# Patient Record
Sex: Male | Born: 1985 | Race: White | Marital: Married | Smoking: Never smoker
Health system: Southern US, Community
[De-identification: ages and names within clinical notes are randomized; demographics above are authoritative.]

## PROBLEM LIST (undated history)

## (undated) DIAGNOSIS — Z789 Other specified health status: Secondary | ICD-10-CM

## (undated) DIAGNOSIS — T8859XA Other complications of anesthesia, initial encounter: Secondary | ICD-10-CM

## (undated) DIAGNOSIS — Z9889 Other specified postprocedural states: Secondary | ICD-10-CM

## (undated) DIAGNOSIS — J45909 Unspecified asthma, uncomplicated: Secondary | ICD-10-CM

---

## 2005-06-03 ENCOUNTER — Emergency Department: Payer: Self-pay | Admitting: Emergency Medicine

## 2007-04-08 ENCOUNTER — Ambulatory Visit: Payer: Self-pay | Admitting: Internal Medicine

## 2009-02-01 ENCOUNTER — Ambulatory Visit: Payer: Self-pay | Admitting: General Practice

## 2010-07-08 ENCOUNTER — Emergency Department: Payer: Self-pay | Admitting: Emergency Medicine

## 2010-07-10 ENCOUNTER — Ambulatory Visit: Payer: Self-pay | Admitting: General Practice

## 2011-04-03 ENCOUNTER — Ambulatory Visit: Payer: Self-pay | Admitting: General Practice

## 2011-08-14 ENCOUNTER — Other Ambulatory Visit: Payer: Self-pay | Admitting: Neurosurgery

## 2011-08-14 DIAGNOSIS — M5410 Radiculopathy, site unspecified: Secondary | ICD-10-CM

## 2011-08-14 DIAGNOSIS — M79603 Pain in arm, unspecified: Secondary | ICD-10-CM

## 2011-08-14 DIAGNOSIS — M542 Cervicalgia: Secondary | ICD-10-CM

## 2011-08-20 ENCOUNTER — Ambulatory Visit
Admission: RE | Admit: 2011-08-20 | Discharge: 2011-08-20 | Disposition: A | Discharge status: Home / Self Care | Payer: PRIVATE HEALTH INSURANCE | Source: Ambulatory Visit | Attending: Neurosurgery | Admitting: Neurosurgery

## 2011-08-20 DIAGNOSIS — M542 Cervicalgia: Secondary | ICD-10-CM

## 2011-08-20 DIAGNOSIS — M79603 Pain in arm, unspecified: Secondary | ICD-10-CM

## 2011-08-20 DIAGNOSIS — M5410 Radiculopathy, site unspecified: Secondary | ICD-10-CM

## 2011-08-20 MED ORDER — IOHEXOL 300 MG/ML  SOLN
8.0000 mL | Freq: Once | INTRAMUSCULAR | Status: AC | PRN
  Administered 2011-08-20: 8 mL via INTRATHECAL

## 2011-08-20 MED ORDER — HYDROCODONE-ACETAMINOPHEN 5-325 MG PO TABS
1.0000 | ORAL_TABLET | Freq: Once | ORAL | Status: AC
  Administered 2011-08-20: 1 via ORAL

## 2011-08-20 MED ORDER — DIAZEPAM 5 MG PO TABS
10.0000 mg | ORAL_TABLET | Freq: Once | ORAL | Status: AC
  Administered 2011-08-20: 10 mg via ORAL

## 2011-08-20 NOTE — Progress Notes (Signed)
Explained discharge instructions to pt and his wife, consent signed and valium given.

## 2011-08-20 NOTE — Patient Instructions (Signed)
Myelogram   Discharge Instructions  1. Go home and rest quietly for the next 24 hours.  It is important to lie flat for the next 24 hours.  Get up only to go to the restroom.  You may lie in the bed or on a couch on your back, your stomach, your left side or your right side.  You may have one pillow under your head.  You may have pillows between your knees while you are on your side or under your knees while you are on your back.  2. DO NOT drive today.  Recline the seat as far back as it will go, while still wearing your seat belt, on the way home.  3. You may get up to go to the bathroom as needed.  You may sit up for 10 minutes to eat.  You may resume your normal diet and medications unless otherwise indicated.  4. The incidence of headache, nausea, or vomiting is about 5% (one in 20 patients).  If you develop a headache, lie flat and drink plenty of fluids until the headache goes away.  Caffeinated beverages may be helpful.  If you develop severe nausea and vomiting or a headache that does not go away with flat bed rest, call [phone].  5. You may resume normal activities after your 24 hours of bed rest is over; however, do not exert yourself strongly or do any heavy lifting tomorrow.  6. Call your physician for a follow-up appointment.  The results of your myelogram will be sent directly to your physician by the following day.  7. If you have any questions or if complications develop after you arrive home, please call [phone].  Discharge instructions have been explained to the patient.  The patient, or the person responsible for the patient, fully understands these instructions.  

## 2012-07-23 ENCOUNTER — Ambulatory Visit: Payer: Self-pay | Admitting: General Practice

## 2013-10-02 ENCOUNTER — Ambulatory Visit: Payer: Self-pay | Admitting: General Practice

## 2015-10-03 ENCOUNTER — Encounter: Payer: Self-pay | Admitting: Physician Assistant

## 2015-10-03 ENCOUNTER — Ambulatory Visit: Payer: Self-pay | Admitting: Family

## 2015-10-03 VITALS — BP 125/80 | HR 69 | Temp 98.5°F

## 2015-10-03 DIAGNOSIS — J019 Acute sinusitis, unspecified: Secondary | ICD-10-CM

## 2015-10-03 MED ORDER — AMOXICILLIN 875 MG PO TABS: 875.0000 mg | ORAL_TABLET | Freq: Two times a day (BID) | ORAL | Status: DC

## 2015-10-03 NOTE — Addendum Note (Signed)
Addended by: Mirian Mo A on: 10/03/2015 11:57 AM   Modules accepted: Orders

## 2015-10-03 NOTE — Progress Notes (Signed)
S / one to two week history of nasal congestion an pressure malaise no fever , denies allergies , SOB   O/ alert pleasant NAD VSS ENT : nasal turbinates swollen, red, friable + left max and frontal tenderness , otherwise unremarkable , neck supple , heart rsr lungs clear A/ acute rhinosinusitis P amoxicillan 875 mg  One bid,  Nasal saline products bid and prn, continue otc. Follow up prn not improving

## 2015-11-08 ENCOUNTER — Ambulatory Visit: Payer: Self-pay | Admitting: Physician Assistant

## 2016-07-23 ENCOUNTER — Ambulatory Visit: Payer: Self-pay | Admitting: Physician Assistant

## 2016-07-23 ENCOUNTER — Encounter: Payer: Self-pay | Admitting: Physician Assistant

## 2016-07-23 VITALS — BP 130/85 | HR 70 | Temp 98.6°F

## 2016-07-23 DIAGNOSIS — M436 Torticollis: Secondary | ICD-10-CM

## 2016-07-23 MED ORDER — BACLOFEN 10 MG PO TABS: 10.0000 mg | ORAL_TABLET | Freq: Three times a day (TID) | ORAL | 0 refills | Status: DC

## 2016-07-23 NOTE — Progress Notes (Signed)
S: c/o pain in neck, spasms, sx for 2 days, [place] way [place] neck got really stiff and couldn't turn it, tried warm rice pack without relief, some numbness and tingling today into left arm, no known injury, just felt like he slept wrong  O: vitals wnl, nad, skin intact, left shoulder with long spasms in midback, decreased rom of neck with hyperextension, grips = b/l, n/v intact  A: torticollis  P: baclofen , ibuprofen 800mg  tid, wet heat followed by ice, consider chiropractor

## 2017-02-21 ENCOUNTER — Ambulatory Visit: Payer: Self-pay | Admitting: Physician Assistant

## 2017-02-21 ENCOUNTER — Encounter: Payer: Self-pay | Admitting: Physician Assistant

## 2017-02-21 VITALS — BP 140/89 | HR 80 | Temp 98.5°F | Resp 16 | Ht 69.0 in | Wt 230.0 lb

## 2017-02-21 DIAGNOSIS — Z0000 Encounter for general adult medical examination without abnormal findings: Secondary | ICD-10-CM

## 2017-02-21 NOTE — Progress Notes (Signed)
   Subjective:Physical exam    Patient ID: Dylan DawsonJoseph J Choi, male    DOB: 1986-03-13, 31 y.o.   MRN: [id number]  HPI Patient present for physical exam. Voices no compliant or concerns.   Review of Systems Negative    Objective:   Physical Exam HEENT unremarkable. Neck supple w/o adenopathy. Lungs CTA and Heart RRR. Abdomen negative HSM, normoactive BS, and SNTTP. No upper/lower extremity deformities. F/E ROM. No spinal deformities. F/E ROM. CNII-XII grossly intact.       Assessment & Plan:Well exam.  Follow up for lab results.

## 2017-04-02 ENCOUNTER — Ambulatory Visit: Payer: Self-pay | Admitting: Physician Assistant

## 2017-04-02 ENCOUNTER — Encounter: Payer: Self-pay | Admitting: Physician Assistant

## 2017-04-02 VITALS — BP 130/80 | HR 75 | Temp 98.7°F | Resp 16

## 2017-04-02 DIAGNOSIS — R635 Abnormal weight gain: Secondary | ICD-10-CM

## 2017-04-02 NOTE — Addendum Note (Signed)
Addended by: Catha BrowEACON, Jery Hollern T on: 04/02/2017 01:53 PM   Modules accepted: Orders

## 2017-04-02 NOTE — Progress Notes (Signed)
S: pt concerned about recent weight gain, states he has gained 20lbs in the past year, states he is a little stressed bc they moved in with his parents while their house is being built, does work out, is only eating lunch and dinner, no snacks or breakfast  O: vitals wnl, nad, lungs c t a, cv rrr, ?thyroid enlarged  A: weight gain  P: discussed eating habits, increase number of times eating per day, cut back on carbs and add protein, will do labs today

## 2017-04-03 LAB — CMP12+LP+TP+TSH+6AC+CBC/D/PLT
ALBUMIN: 4.8 g/dL (ref 3.5–5.5)
ALT: 25 IU/L (ref 0–44)
AST: 22 IU/L (ref 0–40)
Albumin/Globulin Ratio: 2.2 (ref 1.2–2.2)
Alkaline Phosphatase: 112 IU/L (ref 39–117)
BASOS: 0 %
BUN/Creatinine Ratio: 12 (ref 9–20)
BUN: 13 mg/dL (ref 6–20)
Basophils Absolute: 0 10*3/uL (ref 0.0–0.2)
Bilirubin Total: 0.3 mg/dL (ref 0.0–1.2)
CALCIUM: 9.8 mg/dL (ref 8.7–10.2)
CHOL/HDL RATIO: 5.1 ratio — AB (ref 0.0–5.0)
Chloride: 101 mmol/L (ref 96–106)
Cholesterol, Total: 204 mg/dL — ABNORMAL HIGH (ref 100–199)
Creatinine, Ser: 1.13 mg/dL (ref 0.76–1.27)
EOS (ABSOLUTE): 0.4 10*3/uL (ref 0.0–0.4)
ESTIMATED CHD RISK: 1.1 times avg. — AB (ref 0.0–1.0)
Eos: 5 %
Free Thyroxine Index: 1.5 (ref 1.2–4.9)
GFR calc Af Amer: 100 mL/min/{1.73_m2} (ref >59)
GFR calc non Af Amer: 86 mL/min/{1.73_m2} (ref >59)
GGT: 21 IU/L (ref 0–65)
GLOBULIN, TOTAL: 2.2 g/dL (ref 1.5–4.5)
Glucose: 84 mg/dL (ref 65–99)
HDL: 40 mg/dL (ref >39)
HEMATOCRIT: 46.1 % (ref 37.5–51.0)
Hemoglobin: 15.4 g/dL (ref 13.0–17.7)
Immature Grans (Abs): 0 10*3/uL (ref 0.0–0.1)
Immature Granulocytes: 0 %
Iron: 129 ug/dL (ref 38–169)
LDH: 212 IU/L (ref 121–224)
LYMPHS: 23 %
Lymphocytes Absolute: 1.6 10*3/uL (ref 0.7–3.1)
MCH: 27.7 pg (ref 26.6–33.0)
MCHC: 33.4 g/dL (ref 31.5–35.7)
MCV: 83 fL (ref 79–97)
MONOS ABS: 0.5 10*3/uL (ref 0.1–0.9)
Monocytes: 7 %
NEUTROS ABS: 4.5 10*3/uL (ref 1.4–7.0)
Neutrophils: 65 %
PHOSPHORUS: 4.1 mg/dL (ref 2.5–4.5)
POTASSIUM: 4.3 mmol/L (ref 3.5–5.2)
Platelets: 298 10*3/uL (ref 150–379)
RBC: 5.56 x10E6/uL (ref 4.14–5.80)
RDW: 13 % (ref 12.3–15.4)
SODIUM: 143 mmol/L (ref 134–144)
T3 Uptake Ratio: 27 % (ref 24–39)
T4, Total: 5.5 ug/dL (ref 4.5–12.0)
TRIGLYCERIDES: 464 mg/dL — AB (ref 0–149)
TSH: 2.27 u[IU]/mL (ref 0.450–4.500)
Total Protein: 7 g/dL (ref 6.0–8.5)
Uric Acid: 6.1 mg/dL (ref 3.7–8.6)
WBC: 6.9 10*3/uL (ref 3.4–10.8)

## 2017-04-05 LAB — HGB A1C W/O EAG: Hgb A1c MFr Bld: 5.2 % (ref 4.8–5.6)

## 2017-04-05 LAB — SPECIMEN STATUS REPORT

## 2018-03-06 ENCOUNTER — Ambulatory Visit: Payer: Self-pay | Admitting: Family Medicine

## 2018-03-06 VITALS — BP 128/82 | HR 71 | Ht 69.0 in | Wt 236.0 lb

## 2018-03-06 DIAGNOSIS — Z008 Encounter for other general examination: Secondary | ICD-10-CM

## 2018-03-06 DIAGNOSIS — Z0189 Encounter for other specified special examinations: Principal | ICD-10-CM

## 2018-03-06 NOTE — Progress Notes (Signed)
Subjective: Annual biometrics screening  Patient presents for his annual biometric screening. Patient reports eating a healthy, well-rounded diet and getting regular physical activity.  Patient does not regularly see a primary care provider. PCP: None currently. Patient works for the [employer]. Patient denies any other issues or concerns.   Review of Systems Unremarkable  Objective  Physical Exam General: Awake, alert and oriented. No acute distress. Well developed, hydrated and nourished. Appears stated age.  HEENT: Supple neck without adenopathy. Sclera is non-icteric. The ear canal is clear without discharge. The tympanic membrane is normal in appearance with normal landmarks and cone of light. Nasal mucosa is pink and moist. Oral mucosa is pink and moist. The pharynx is normal in appearance without tonsillar swelling or exudates.  Skin: Skin in warm, dry and intact without rashes or lesions. Appropriate color for ethnicity. Cardiac: Heart rate and rhythm are normal. No murmurs, gallops, or rubs are auscultated.  Respiratory: The chest wall is symmetric and without deformity. No signs of respiratory distress. Lung sounds are clear in all lobes bilaterally without rales, ronchi, or wheezes.  Neurological: The patient is awake, alert and oriented to person, place, and time with normal speech.  Memory is normal and thought processes intact. No gait abnormalities are appreciated.  Psychiatric: Appropriate mood and affect.   Assessment Annual biometrics screening  Plan  Lipid panel and fasting blood sugar pending. Encouraged routine visits with primary care provider.  Provided patient with a list of local resources and encouraged him to establish care with a primary care provider. Encouraged patient to get regular exercise and eat a healthy, well-rounded diet.

## 2018-03-07 LAB — LIPID PANEL
Chol/HDL Ratio: 4 ratio (ref 0.0–5.0)
Cholesterol, Total: 187 mg/dL (ref 100–199)
HDL: 47 mg/dL (ref >39)
LDL Calculated: 120 mg/dL — ABNORMAL HIGH (ref 0–99)
TRIGLYCERIDES: 102 mg/dL (ref 0–149)
VLDL CHOLESTEROL CAL: 20 mg/dL (ref 5–40)

## 2018-03-07 LAB — GLUCOSE, RANDOM: GLUCOSE: 79 mg/dL (ref 65–99)

## 2018-03-10 NOTE — Progress Notes (Signed)
Dylan Choi, Will you call the patient and inform them that their lipid panel and fasting blood sugar came back?  Everything is normal, with the exception of his LDL cholesterol. The LDL cholesterol ("bad cholesterol") is elevated at 120, normal values are below 99. Please advise the patient to follow-up with their primary care provider regarding these results.

## 2019-07-06 ENCOUNTER — Other Ambulatory Visit: Payer: Self-pay

## 2019-07-06 ENCOUNTER — Ambulatory Visit: Payer: Managed Care, Other (non HMO) | Admitting: Internal Medicine

## 2019-07-06 VITALS — BP 118/78 | HR 85 | Temp 97.6°F | Resp 20 | Ht 69.0 in | Wt 250.0 lb

## 2019-07-06 DIAGNOSIS — E6609 Other obesity due to excess calories: Secondary | ICD-10-CM

## 2019-07-06 DIAGNOSIS — Z6836 Body mass index (BMI) 36.0-36.9, adult: Secondary | ICD-10-CM | POA: Diagnosis not present

## 2019-07-06 DIAGNOSIS — Z008 Encounter for other general examination: Secondary | ICD-10-CM | POA: Diagnosis not present

## 2019-07-06 NOTE — Progress Notes (Signed)
[facility]  Patient: Dylan Choi, DOB - 09-21-1985  Subjective:  Here for Biometric Screen/brief exam Patient is a 33 year old male in no acute distress who comes to the clinic for his biometric screening and brief exam. Patient does not regularly see a primary care provider. He is employed by [employer] and works as a Tax adviser, [employer]. He reports he "usually" tries to eat a healthy diet, and admits in the last 6 months, has not been exercising regularly (besides chasing around his 34 year old) with gym closure with Covid an issue  He denies any concerns at today's visit.  He reports he feels well.   Patient  denies any fever, body aches,chills, rash, chest pain, shortness of breath, nausea, vomiting, abdominal pain, or diarrhea.  He denies tobacco use, drug use. Drinks alcohol occasionally, social.  He denies any known drug allergies.  Has an allergy to cats.   He denies any routine medications (takes ibuprofen prn) or chronic health problems.  Objective: BP 118/78 (BP Location: Left Arm, Patient Position: Sitting, Cuff Size: Large)   Pulse 85   Temp 97.6 F (36.4 C) (Temporal)   Resp 20   Ht 5\' 9"  (1.753 m)   Wt 250 lb (113.4 kg)   SpO2 100%   BMI 36.92 kg/m   NAD, well-developed well-nourished, overweight Patient is alert and oriented and responsive to questions. Engages in eye contact with provider. Speaks in full sentences without any pauses without any shortness of breath or distress.  HEENT: Within normal limits Neck: supple, thyroid normal, no anterior or posterior cervical lymphadenopathy. Heart: Regular rate and rhythm without murmurs rubs or gallops.   Lungs: Clear auscultation without any adventitious lung sounds. Abdomen: mildly obese, NT Neuro: Patient moves on and off of exam table and in room without difficulty. Gait is normal and grossly non-focal.  Assessment: Biometric  screen 1. Encounter for other general examination-brief biometric exam with biometric screening-this is not a full annual physical.   2. Encounter for biometric screening  3. Increased BMI   Plan:  I will have the office call you on your glucose and cholesterol results when they return if you have not heard within 1 week please call the office or sent to your Mychart account. Please call the clinic during office hours with any questions or concerns.  This biometric physical is a brief physical and the only labs done are glucose and your lipid panel(cholesterol) and is  not a substitute for seeing a primary care provider for a complete annual physical. Please see a primary care physician for routine health maintenance, labs and full physical at least yearly and follow up as recommended by your provider. Provider also recommends if you do not have a primary care provider for patient to establish care as soon as possible .Patient may chose provider of choice. Also gave the [facility] at [phone] or web site at [email or web address] to help assist with finding a primary care doctor.  Patient verbalizes understanding that his office is acute care only and not a substitute for a primary care or for the management of chronic conditions.    Fasting glucose and lipids. Discussed with patient that today's visit here is a limited biometric screening visit (not a comprehensive exam or management of any chronic problems) Discussed some health issues, including healthy eating habits and importance of regular aerobic exercise and encouraged to increase, noted his BMI was  increased and he would fall in the obesity category based on BMI and he was aware of that with importance of weight management over time encouraged. Encouraged to follow-up with PCP for annual comprehensive preventive and wellness care over time (and if applicable, any chronic issues). Questions  invited and answered.  Note by C. Dorris Fetch, MD

## 2019-07-07 LAB — LIPID PANEL
Chol/HDL Ratio: 4.1 ratio (ref 0.0–5.0)
Cholesterol, Total: 189 mg/dL (ref 100–199)
HDL: 46 mg/dL (ref >39)
LDL Chol Calc (NIH): 120 mg/dL — ABNORMAL HIGH (ref 0–99)
Triglycerides: 130 mg/dL (ref 0–149)
VLDL Cholesterol Cal: 23 mg/dL (ref 5–40)

## 2019-07-07 LAB — GLUCOSE, RANDOM: Glucose: 92 mg/dL (ref 65–99)

## 2019-07-07 NOTE — Progress Notes (Signed)
Spoke with patient regarding his lab results and dietary recommendations given by the Provider. Patient verbalized understanding.

## 2020-03-01 ENCOUNTER — Other Ambulatory Visit: Payer: Self-pay

## 2020-03-01 ENCOUNTER — Other Ambulatory Visit: Payer: Self-pay | Admitting: Infectious Diseases

## 2020-03-01 ENCOUNTER — Ambulatory Visit
Admission: RE | Admit: 2020-03-01 | Discharge: 2020-03-01 | Disposition: A | Discharge status: Home / Self Care | Payer: Managed Care, Other (non HMO) | Source: Ambulatory Visit | Attending: Infectious Diseases | Admitting: Infectious Diseases

## 2020-03-01 DIAGNOSIS — R109 Unspecified abdominal pain: Secondary | ICD-10-CM | POA: Diagnosis present

## 2020-03-01 DIAGNOSIS — R1031 Right lower quadrant pain: Secondary | ICD-10-CM | POA: Diagnosis present

## 2020-03-06 ENCOUNTER — Emergency Department
Admission: EM | Admit: 2020-03-06 | Discharge: 2020-03-06 | Disposition: A | Discharge status: Home / Self Care | Payer: Managed Care, Other (non HMO) | Attending: Emergency Medicine | Admitting: Emergency Medicine

## 2020-03-06 ENCOUNTER — Other Ambulatory Visit: Payer: Self-pay

## 2020-03-06 ENCOUNTER — Emergency Department: Payer: Managed Care, Other (non HMO)

## 2020-03-06 DIAGNOSIS — N200 Calculus of kidney: Secondary | ICD-10-CM | POA: Diagnosis not present

## 2020-03-06 DIAGNOSIS — R109 Unspecified abdominal pain: Secondary | ICD-10-CM | POA: Diagnosis present

## 2020-03-06 LAB — URINALYSIS, COMPLETE (UACMP) WITH MICROSCOPIC
Bilirubin Urine: NEGATIVE
Glucose, UA: NEGATIVE mg/dL
Ketones, ur: NEGATIVE mg/dL
Leukocytes,Ua: NEGATIVE
Nitrite: NEGATIVE
Protein, ur: NEGATIVE mg/dL
Specific Gravity, Urine: 1.015 (ref 1.005–1.030)
pH: 5 (ref 5.0–8.0)

## 2020-03-06 LAB — HEPATIC FUNCTION PANEL
ALT: 31 U/L (ref 0–44)
AST: 30 U/L (ref 15–41)
Albumin: 4.7 g/dL (ref 3.5–5.0)
Alkaline Phosphatase: 91 U/L (ref 38–126)
Bilirubin, Direct: 0.1 mg/dL (ref 0.0–0.2)
Indirect Bilirubin: 0.6 mg/dL (ref 0.3–0.9)
Total Bilirubin: 0.7 mg/dL (ref 0.3–1.2)
Total Protein: 7.4 g/dL (ref 6.5–8.1)

## 2020-03-06 LAB — BASIC METABOLIC PANEL
Anion gap: 10 (ref 5–15)
BUN: 14 mg/dL (ref 6–20)
CO2: 28 mmol/L (ref 22–32)
Calcium: 9.5 mg/dL (ref 8.9–10.3)
Chloride: 102 mmol/L (ref 98–111)
Creatinine, Ser: 1.25 mg/dL — ABNORMAL HIGH (ref 0.61–1.24)
GFR calc Af Amer: >60 mL/min (ref >60)
GFR calc non Af Amer: >60 mL/min (ref >60)
Glucose, Bld: 105 mg/dL — ABNORMAL HIGH (ref 70–99)
Potassium: 4.1 mmol/L (ref 3.5–5.1)
Sodium: 140 mmol/L (ref 135–145)

## 2020-03-06 LAB — CBC
HCT: 42.5 % (ref 39.0–52.0)
Hemoglobin: 14 g/dL (ref 13.0–17.0)
MCH: 25.5 pg — ABNORMAL LOW (ref 26.0–34.0)
MCHC: 32.9 g/dL (ref 30.0–36.0)
MCV: 77.3 fL — ABNORMAL LOW (ref 80.0–100.0)
Platelets: 291 10*3/uL (ref 150–400)
RBC: 5.5 MIL/uL (ref 4.22–5.81)
RDW: 13.8 % (ref 11.5–15.5)
WBC: 10.7 10*3/uL — ABNORMAL HIGH (ref 4.0–10.5)
nRBC: 0 % (ref 0.0–0.2)

## 2020-03-06 LAB — LIPASE, BLOOD: Lipase: 32 U/L (ref 11–51)

## 2020-03-06 MED ORDER — MORPHINE SULFATE (PF) 4 MG/ML IV SOLN
4.0000 mg | Freq: Once | INTRAVENOUS | Status: AC
  Administered 2020-03-06: 4 mg via INTRAVENOUS
  Filled 2020-03-06: qty 1

## 2020-03-06 MED ORDER — SODIUM CHLORIDE 0.9 % IV BOLUS
1000.0000 mL | Freq: Once | INTRAVENOUS | Status: AC
  Administered 2020-03-06: 1000 mL via INTRAVENOUS

## 2020-03-06 MED ORDER — OXYCODONE HCL 5 MG PO TABS
5.0000 mg | ORAL_TABLET | Freq: Once | ORAL | Status: AC
  Administered 2020-03-06: 5 mg via ORAL
  Filled 2020-03-06: qty 1

## 2020-03-06 MED ORDER — HYDROMORPHONE HCL 1 MG/ML IJ SOLN
1.0000 mg | Freq: Once | INTRAMUSCULAR | Status: DC | PRN
  Filled 2020-03-06: qty 1

## 2020-03-06 MED ORDER — METOCLOPRAMIDE HCL 5 MG/ML IJ SOLN
10.0000 mg | Freq: Once | INTRAMUSCULAR | Status: AC
  Administered 2020-03-06: 10 mg via INTRAVENOUS
  Filled 2020-03-06: qty 2

## 2020-03-06 MED ORDER — KETOROLAC TROMETHAMINE 30 MG/ML IJ SOLN
15.0000 mg | Freq: Once | INTRAMUSCULAR | Status: AC
  Administered 2020-03-06: 15 mg via INTRAVENOUS
  Filled 2020-03-06: qty 1

## 2020-03-06 MED ORDER — HYDROMORPHONE HCL 1 MG/ML IJ SOLN
1.0000 mg | Freq: Once | INTRAMUSCULAR | Status: AC
  Administered 2020-03-06: 1 mg via INTRAVENOUS
  Filled 2020-03-06: qty 1

## 2020-03-06 MED ORDER — ONDANSETRON HCL 4 MG/2ML IJ SOLN
4.0000 mg | Freq: Once | INTRAMUSCULAR | Status: AC
  Administered 2020-03-06: 4 mg via INTRAVENOUS
  Filled 2020-03-06: qty 2

## 2020-03-06 NOTE — Discharge Instructions (Addendum)
You can continue take your pain medication, nausea medicine and Flomax prescribed by your primary care doctor.  If you need more pain medication call your primary care doctor tomorrow.  You can also take some ibuprofen 600 mg every 8 hours with food to help with any discomfort.  Your stone seems to be moving but still needs some time to move all the way out.  He should call urology on Monday saying ER follow-up this week.  Return to the ER if you develop fevers, worsening pain, vomiting or any other concerns  IMPRESSION: 1. There is a 4 mm stone in the distal right ureter. This stone was seen proximally previously but is now located distally just above the right UVJ. The distal ureteral stone results in mild to moderate hydronephrosis, perinephric stranding, and ureterectasis. 2. Other renal stones as above.

## 2020-03-06 NOTE — ED Triage Notes (Signed)
Pt states known right renal calculi. Pt states right flank pain with vomiting that began last at 2100. Pt denies diarrhea, fever.

## 2020-03-06 NOTE — ED Provider Notes (Signed)
[facility] Emergency Department Provider Note  ____________________________________________   First MD Initiated Contact with Patient 03/06/20 [phone]     (approximate)  I have reviewed the triage vital signs and the nursing notes.   HISTORY  Chief Complaint Flank Pain    HPI Dylan Choi is a 34 y.o. male who is otherwise healthy who comes in for kidney stone.  Patient had a kidney stone diagnosed recently.  States he is never passed a kidney stone before.  States that he is pain initially began earlier this week but then seemed to resolve but he had continuation of his pain today that started tonight, severe, constant, not better with Vicodin, nothing made it worse.  He does report a little bit of urinary frequency but no pain.  No fevers.  Did have an episode of vomiting.  On review of records on 6/22 patient had a 4 mm calculus in the proximal third of his right ureter with some mild associated right hydronephrosis.     Medical: Otherwise healthy   There are no problems to display for this patient.    Prior to Admission medications   Medication Sig Start Date End Date Taking? Authorizing Provider  baclofen (LIORESAL) 10 MG tablet Take 1 tablet (10 mg total) by mouth 3 (three) times daily. Patient not taking: Reported on 02/21/2017 07/23/16   Faythe Ghee, PA-C  beclomethasone (QVAR) 80 MCG/ACT inhaler Inhale into the lungs. 12/23/14 12/23/15  [provider]  ibuprofen (ADVIL,MOTRIN) 800 MG tablet Take by mouth. 11/11/15   [provider]    Allergies Patient has no known allergies.  No family history on file.  Social History Social History   Tobacco Use  . Smoking status: Never Smoker  . Smokeless tobacco: Never Used  Substance Use Topics  . Alcohol use: Not on file  . Drug use: Not on file      Review of Systems Constitutional: No fever/chills Eyes: No visual changes. ENT: No sore throat. Cardiovascular:  Denies chest pain. Respiratory: Denies shortness of breath. Gastrointestinal: Positive abdominal pain, vomiting Genitourinary: Negative for dysuria. Musculoskeletal: Negative for back pain. Skin: Negative for rash. Neurological: Negative for headaches, focal weakness or numbness. All other ROS negative ____________________________________________   PHYSICAL EXAM:  VITAL SIGNS: ED Triage Vitals [03/06/20 0352]  Enc Vitals Group     BP (!) 154/96     Pulse Rate 83     Resp 16     Temp 99 F (37.2 C)     Temp Source Oral     SpO2 100 %     Weight 225 lb (102.1 kg)     Height 5\' 9"  (1.753 m)     Head Circumference      Peak Flow      Pain Score 8     Pain Loc      Pain Edu?      Excl. in GC?     Constitutional: Alert and oriented.  Patient is walking around in the room due to pain Eyes: Conjunctivae are normal. EOMI. Head: Atraumatic. Nose: No congestion/rhinnorhea. Mouth/Throat: Mucous membranes are moist.   Neck: No stridor. Trachea Midline. FROM Cardiovascular: Normal rate, regular rhythm. Grossly normal heart sounds.  Good peripheral circulation. Respiratory: Normal respiratory effort.  No retractions. Lungs CTAB. Gastrointestinal: Soft and nontender. No distention. No abdominal bruits.  Musculoskeletal: No lower extremity tenderness nor edema.  No joint effusions. Neurologic:  Normal speech and language. No gross focal neurologic deficits  are appreciated.  Skin:  Skin is warm, dry and intact. No rash noted. Psychiatric: Mood and affect are normal. Speech and behavior are normal. GU: Deferred  Back: Right flank tenderness ____________________________________________   LABS (all labs ordered are listed, but only abnormal results are displayed)  Labs Reviewed  URINALYSIS, COMPLETE (UACMP) WITH MICROSCOPIC - Abnormal; Notable for the following components:      Result Value   Color, Urine YELLOW (*)    APPearance CLEAR (*)    Hgb urine dipstick SMALL (*)     Bacteria, UA RARE (*)    All other components within normal limits  CBC - Abnormal; Notable for the following components:   WBC 10.7 (*)    MCV 77.3 (*)    MCH 25.5 (*)    All other components within normal limits  BASIC METABOLIC PANEL - Abnormal; Notable for the following components:   Glucose, Bld 105 (*)    Creatinine, Ser 1.25 (*)    All other components within normal limits  HEPATIC FUNCTION PANEL  LIPASE, BLOOD   ____________________________________________   RADIOLOGY   Official radiology report(s): CT Renal Stone Study  Result Date: 03/06/2020 CLINICAL DATA:  Flank pain.  Kidney suspected. EXAM: CT ABDOMEN AND PELVIS WITHOUT CONTRAST TECHNIQUE: Multidetector CT imaging of the abdomen and pelvis was performed following the standard protocol without IV contrast. COMPARISON:  March 01, 2020 FINDINGS: Lower chest: No acute abnormality. Hepatobiliary: No focal liver abnormality is seen. No gallstones, gallbladder wall thickening, or biliary dilatation. Pancreas: Unremarkable. No pancreatic ductal dilatation or surrounding inflammatory changes. Spleen: Normal in size without focal abnormality. Adrenals/Urinary Tract: Adrenal glands are normal. At least 3 tiny stones are seen in the left kidney without hydronephrosis or perinephric stranding. Two stones are seen in the right kidney. The largest measures between 3 and 4 mm on coronal image 88. Hydronephrosis is seen on the right, mild to moderate. Mild perinephric stranding is identified. Right ureterectasis and periureteral stranding is noted. The proximal right ureteral stone on the previous study has migrated into the distal right ureter, just above the right UVJ measuring 4 mm. The left ureter is normal. The bladder is decompressed but grossly unremarkable given lack of distention. Stomach/Bowel: Stomach and small bowel are normal. The colon is normal. The appendix is normal. Vascular/Lymphatic: No significant vascular findings are  present. No enlarged abdominal or pelvic lymph nodes. Reproductive: Prostate is unremarkable. Other: No abdominal wall hernia or abnormality. No abdominopelvic ascites. Musculoskeletal: No acute or significant osseous findings. IMPRESSION: 1. There is a 4 mm stone in the distal right ureter. This stone was seen proximally previously but is now located distally just above the right UVJ. The distal ureteral stone results in mild to moderate hydronephrosis, perinephric stranding, and ureterectasis. 2. Other renal stones as above. Electronically Signed   By: Gerome Sam III M.D   On: 03/06/2020 05:18    ____________________________________________   PROCEDURES  Procedure(s) performed (including Critical Care):  Procedures   ____________________________________________   INITIAL IMPRESSION / ASSESSMENT AND PLAN / ED COURSE  Dylan Choi was evaluated in Emergency Department on 03/06/2020 for the symptoms described in the history of present illness. He was evaluated in the context of the global COVID-19 pandemic, which necessitated consideration that the patient might be at risk for infection with the SARS-CoV-2 virus that causes COVID-19. Institutional protocols and algorithms that pertain to the evaluation of patients at risk for COVID-19 are in a state of rapid change based on information  released by regulatory bodies including the CDC and federal and state organizations. These policies and algorithms were followed during the patient's care in the ED.    Patient is a 34 year old who comes in with worsening right flank pain.  Initially his pain had resolved but seem to gotten worse starting today.  Most likely this is progression of his kidney stone.  Discussed with family on the pros and cons of repeat CT imaging today but family would like to proceed to make sure the stone is passing, there is no additional stones, no worsening hydronephrosis, no evidence of renal rupture or other acute  pathology.  Patient does not have any fevers but will get urine to make sure no evidence of UTI.  Will give patient a dose of IV Dilaudid and IV Zofran and IV fluid while awaiting results  Labs are reassuring.  Patient has only slight white count elevation at 10.7. he is got no WBCs in his urine and there is does not seem to be an infection. Denies being on antibiotics.  Kidney function was slightly elevated at 1.25.  Patient was given 1 L of fluid suspect that this is better now  CT scan shows that the 4 mm stone is now in the distal right ureter with now mild to moderate hydronephrosis and some perinephric stranding.  Patient stated that the Dilaudid did not seem to really work and is requesting some morphine.  This is worked previously for him.  We will give a dose of IV morphine and some Reglan.  Patient was given a dose of Toradol as well to help with discomfort.  6:47 AM on repeat evaluation patient states his pain is much better.  We discussed doing a dose of oxycodone as he has IV medications wear off.  Patient expressed understanding felt comfortable with this plan.  Patient does not show any signs of respiratory depression.  Discussed with patient indications for admission significant pain not controlled with medications, recurrent vomiting, evidence of fever.  At this time patient feels comfortable try to go back home with his p.o. medications.  Patient still has some Vicodin prescribed by his primary care doctor so we will finish this course first and then contact his primary care doctor if he needs additional medicine.  He already has tamsulosin and Zofran at this time.  I also given him urology's number to call on Monday to try to get follow-up for this week in case patient is not passing the stone he can be considered for lithotripsy.     ____________________________________________   FINAL CLINICAL IMPRESSION(S) / ED DIAGNOSES   Final diagnoses:  Nephrolithiasis       MEDICATIONS GIVEN DURING THIS VISIT:  Medications  HYDROmorphone (DILAUDID) injection 1 mg (1 mg Intravenous Given 03/06/20 0413)  ondansetron (ZOFRAN) injection 4 mg (4 mg Intravenous Given 03/06/20 0414)  sodium chloride 0.9 % bolus 1,000 mL (0 mLs Intravenous Stopped 03/06/20 0538)  ketorolac (TORADOL) 30 MG/ML injection 15 mg (15 mg Intravenous Given 03/06/20 0451)  morphine 4 MG/ML injection 4 mg (4 mg Intravenous Given 03/06/20 0540)  metoCLOPramide (REGLAN) injection 10 mg (10 mg Intravenous Given 03/06/20 0539)  oxyCODONE (Oxy IR/ROXICODONE) immediate release tablet 5 mg (5 mg Oral Given 03/06/20 5329)     ED Discharge Orders    None       Note:  This document was prepared using Dragon voice recognition software and may include unintentional dictation errors.   Vanessa Anita, MD 03/06/20 [phone]

## 2020-03-10 ENCOUNTER — Other Ambulatory Visit: Payer: Self-pay | Admitting: *Deleted

## 2020-03-10 DIAGNOSIS — J45909 Unspecified asthma, uncomplicated: Secondary | ICD-10-CM | POA: Insufficient documentation

## 2020-03-10 DIAGNOSIS — N200 Calculus of kidney: Secondary | ICD-10-CM

## 2020-03-15 ENCOUNTER — Ambulatory Visit (INDEPENDENT_AMBULATORY_CARE_PROVIDER_SITE_OTHER): Payer: Managed Care, Other (non HMO) | Admitting: Urology

## 2020-03-15 ENCOUNTER — Ambulatory Visit
Admission: RE | Admit: 2020-03-15 | Discharge: 2020-03-15 | Disposition: A | Discharge status: Home / Self Care | Payer: Managed Care, Other (non HMO) | Attending: Urology | Admitting: Urology

## 2020-03-15 ENCOUNTER — Encounter: Payer: Self-pay | Admitting: Urology

## 2020-03-15 ENCOUNTER — Ambulatory Visit
Admission: RE | Admit: 2020-03-15 | Discharge: 2020-03-15 | Disposition: A | Discharge status: Home / Self Care | Payer: Managed Care, Other (non HMO) | Source: Ambulatory Visit | Attending: Urology | Admitting: Urology

## 2020-03-15 ENCOUNTER — Other Ambulatory Visit
Admission: RE | Admit: 2020-03-15 | Discharge: 2020-03-15 | Disposition: A | Discharge status: Home / Self Care | Payer: Managed Care, Other (non HMO) | Source: Home / Self Care | Attending: Urology | Admitting: Urology

## 2020-03-15 ENCOUNTER — Telehealth: Payer: Self-pay

## 2020-03-15 ENCOUNTER — Other Ambulatory Visit: Payer: Self-pay

## 2020-03-15 VITALS — BP 131/85 | HR 74 | Ht 69.0 in | Wt 233.0 lb

## 2020-03-15 DIAGNOSIS — N201 Calculus of ureter: Secondary | ICD-10-CM

## 2020-03-15 DIAGNOSIS — N200 Calculus of kidney: Secondary | ICD-10-CM | POA: Insufficient documentation

## 2020-03-15 LAB — URINALYSIS, COMPLETE (UACMP) WITH MICROSCOPIC
Bacteria, UA: NONE SEEN
Bilirubin Urine: NEGATIVE
Glucose, UA: NEGATIVE mg/dL
Hgb urine dipstick: NEGATIVE
Ketones, ur: NEGATIVE mg/dL
Leukocytes,Ua: NEGATIVE
Nitrite: NEGATIVE
Protein, ur: NEGATIVE mg/dL
Specific Gravity, Urine: >1.03 — ABNORMAL HIGH (ref 1.005–1.030)
pH: 5.5 (ref 5.0–8.0)

## 2020-03-15 NOTE — Patient Instructions (Signed)
Dietary Guidelines to Help Prevent Kidney Stones Kidney stones are deposits of minerals and salts that form inside your kidneys. Your risk of developing kidney stones may be greater depending on your diet, your lifestyle, the medicines you take, and whether you have certain medical conditions. Most people can reduce their chances of developing kidney stones by following the instructions below. Depending on your overall health and the type of kidney stones you tend to develop, your dietitian may give you more specific instructions. What are tips for following this plan? Reading food labels  Choose foods with "no salt added" or "low-salt" labels. Limit your sodium intake to less than 1500 mg per day.  Choose foods with calcium for each meal and snack. Try to eat about 300 mg of calcium at each meal. Foods that contain 200-500 mg of calcium per serving include: ? 8 oz (237 ml) of milk, fortified nondairy milk, and fortified fruit juice. ? 8 oz (237 ml) of kefir, yogurt, and soy yogurt. ? 4 oz (118 ml) of tofu. ? 1 oz of cheese. ? 1 cup (300 g) of dried figs. ? 1 cup (91 g) of cooked broccoli. ? 1-3 oz can of sardines or mackerel.  Most people need 1000 to 1500 mg of calcium each day. Talk to your dietitian about how much calcium is recommended for you. Shopping  Buy plenty of fresh fruits and vegetables. Most people do not need to avoid fruits and vegetables, even if they contain nutrients that may contribute to kidney stones.  When shopping for convenience foods, choose: ? Whole pieces of fruit. ? Premade salads with dressing on the side. ? Low-fat fruit and yogurt smoothies.  Avoid buying frozen meals or prepared deli foods.  Look for foods with live cultures, such as yogurt and kefir. Cooking  Do not add salt to food when cooking. Place a salt shaker on the table and allow each person to add his or her own salt to taste.  Use vegetable protein, such as beans, textured vegetable  protein (TVP), or tofu instead of meat in pasta, casseroles, and soups. Meal planning   Eat less salt, if told by your dietitian. To do this: ? Avoid eating processed or premade food. ? Avoid eating fast food.  Eat less animal protein, including cheese, meat, poultry, or fish, if told by your dietitian. To do this: ? Limit the number of times you have meat, poultry, fish, or cheese each week. Eat a diet free of meat at least 2 days a week. ? Eat only one serving each day of meat, poultry, fish, or seafood. ? When you prepare animal protein, cut pieces into small portion sizes. For most meat and fish, one serving is about the size of one deck of cards.  Eat at least 5 servings of fresh fruits and vegetables each day. To do this: ? Keep fruits and vegetables on hand for snacks. ? Eat 1 piece of fruit or a handful of berries with breakfast. ? Have a salad and fruit at lunch. ? Have two kinds of vegetables at dinner.  Limit foods that are high in a substance called oxalate. These include: ? Spinach. ? Rhubarb. ? Beets. ? Potato chips and french fries. ? Nuts.  If you regularly take a diuretic medicine, make sure to eat at least 1-2 fruits or vegetables high in potassium each day. These include: ? Avocado. ? Banana. ? Orange, prune, carrot, or tomato juice. ? Baked potato. ? Cabbage. ? Beans and split   peas. General instructions   Drink enough fluid to keep your urine clear or pale yellow. This is the most important thing you can do.  Talk to your health care provider and dietitian about taking daily supplements. Depending on your health and the cause of your kidney stones, you may be advised: ? Not to take supplements with vitamin C. ? To take a calcium supplement. ? To take a daily probiotic supplement. ? To take other supplements such as magnesium, fish oil, or vitamin B6.  Take all medicines and supplements as told by your health care provider.  Limit alcohol intake to no  more than 1 drink a day for nonpregnant women and 2 drinks a day for men. One drink equals 12 oz of beer, 5 oz of wine, or 1 oz of hard liquor.  Lose weight if told by your health care provider. Work with your dietitian to find strategies and an eating plan that works best for you. What foods are not recommended? Limit your intake of the following foods, or as told by your dietitian. Talk to your dietitian about specific foods you should avoid based on the type of kidney stones and your overall health. Grains Breads. Bagels. Rolls. Baked goods. Salted crackers. Cereal. Pasta. Vegetables Spinach. Rhubarb. Beets. Canned vegetables. Pickles. Olives. Meats and other protein foods Nuts. Nut butters. Large portions of meat, poultry, or fish. Salted or cured meats. Deli meats. Hot dogs. Sausages. Dairy Cheese. Beverages Regular soft drinks. Regular vegetable juice. Seasonings and other foods Seasoning blends with salt. Salad dressings. Canned soups. Soy sauce. Ketchup. Barbecue sauce. Canned pasta sauce. Casseroles. Pizza. Lasagna. Frozen meals. Potato chips. French fries. Summary  You can reduce your risk of kidney stones by making changes to your diet.  The most important thing you can do is drink enough fluid. You should drink enough fluid to keep your urine clear or pale yellow.  Ask your health care provider or dietitian how much protein from animal sources you should eat each day, and also how much salt and calcium you should have each day. This information is not intended to replace advice given to you by your health care provider. Make sure you discuss any questions you have with your health care provider. Document Revised: 12/17/2018 Document Reviewed: 08/07/2016 Elsevier Patient Education  2020 Elsevier Inc.  

## 2020-03-15 NOTE — Telephone Encounter (Signed)
Called pt, no answer. LM for pt informing him of the information below. Advised pt to call back for questions or concerns.  

## 2020-03-15 NOTE — Progress Notes (Signed)
   03/15/20 9:10 AM   Dylan Choi Aug 17, 1986 [id number]  CC: Right ureteral stone  HPI: Saw Dylan Choi in urology clinic today for evaluation of ureteral stone.  He is a healthy 34 year old male with no prior history of nephrolithiasis who originally presented to the ED on 03/01/2020 with acute onset of severe right flank pain, and CT showed a 4 mm right proximal ureteral stone with upstream hydronephrosis.  He was discharged with medical expulsive therapy, but return to the ED on 6/27 with worsening pain.  A repeat CT was performed which showed migration of the stone to the right distal ureter.  Stone was not clearly seen on scout CT.  He denies any family history of stones.  He has not had any pain or urinary symptoms at all over the last 10 days, and thinks he may have passed the stone.  He has strained his urine intermittently, but there were few days he was out of town and was not straining.  Urinalysis today benign with 0-5 RBCs, 0-5 WBCs, no bacteria, nitrite negative, no leukocytes, urine pH 5.5.  Social History:  reports that he has never smoked. He has never used smokeless tobacco. No history on file for alcohol use and drug use.  Physical Exam: BP 131/85   Pulse 74   Ht 5\' 9"  (1.753 m)   Wt 233 lb (105.7 kg)   BMI 34.41 kg/m    Constitutional:  Alert and oriented, No acute distress. Cardiovascular: No clubbing, cyanosis, or edema. Respiratory: Normal respiratory effort, no increased work of breathing. GI: Abdomen is soft, nontender, nondistended, no abdominal masses GU: No CVA tenderness  Laboratory Data: Reviewed, see HPI  Pertinent Imaging: I have personally reviewed both CTs, stone measures 525HU, 12cm SSD.  Assessment & Plan:   In summary, he is a healthy 33 year old male with a likely spontaneously passed 4 mm right ureteral stone who has been asymptomatic for the last 10 days, and urinalysis is benign today with no microscopic hematuria.  We discussed  different treatment options for stones including medical expulsive therapy, shockwave lithotripsy, ureteroscopy and laser lithotripsy, as well as the risks and benefits.  We discussed that with his symptoms and lab work, I suspect he has passed his stone.  I recommended a KUB today to confirm stone passage, and will call with results.  If stone is still present, would consider SWL if seen on KUB.  We discussed general stone prevention strategies including adequate hydration with goal of producing 2.5 L of urine daily, increasing citric acid intake, increasing calcium intake during high oxalate meals, minimizing animal protein, and decreasing salt intake. Information about dietary recommendations given today.   KUB today, call with results Follow-up as needed  I spent 45 total minutes on the day of the encounter including pre-visit review of the medical record, face-to-face time with the patient, and post visit ordering of labs/imaging/tests.  20, MD 03/15/2020  [facility] [address] [place], [place] [phone]

## 2020-03-15 NOTE — Telephone Encounter (Signed)
-----   Message from Sondra Come, MD sent at 03/15/2020 12:57 PM EDT ----- Larina Bras looks to have passed on KUB today. Follow up as needed, remind him though 10% of stones can't be seen on xray so call us if he develops recurrent right sided flank or groin pain, thanks  Legrand Rams, MD 03/15/2020

## 2020-06-09 ENCOUNTER — Encounter: Payer: Self-pay | Admitting: Physician Assistant

## 2020-06-09 ENCOUNTER — Other Ambulatory Visit: Payer: Self-pay

## 2020-06-09 ENCOUNTER — Ambulatory Visit: Payer: Managed Care, Other (non HMO) | Admitting: Physician Assistant

## 2020-06-09 VITALS — BP 138/80 | HR 80 | Temp 98.0°F | Resp 18 | Ht 68.0 in | Wt 230.0 lb

## 2020-06-09 DIAGNOSIS — Z0000 Encounter for general adult medical examination without abnormal findings: Secondary | ICD-10-CM

## 2020-06-09 DIAGNOSIS — Z008 Encounter for other general examination: Secondary | ICD-10-CM | POA: Diagnosis not present
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
## 2020-06-10 LAB — LIPID PANEL
Chol/HDL Ratio: 3.8 ratio (ref 0.0–5.0)
Cholesterol, Total: 184 mg/dL (ref 100–199)
HDL: 48 mg/dL (ref >39)
LDL Chol Calc (NIH): 121 mg/dL — ABNORMAL HIGH (ref 0–99)
Triglycerides: 81 mg/dL (ref 0–149)
VLDL Cholesterol Cal: 15 mg/dL (ref 5–40)

## 2020-06-10 LAB — GLUCOSE, RANDOM: Glucose: 97 mg/dL (ref 65–99)

## 2020-09-22 IMAGING — CT CT RENAL STONE PROTOCOL
2 of 4 series · 16 of 46 positions shown, 18 images · non-contrast
Comparison: March 01, 2020

CLINICAL DATA: Flank pain.  Kidney suspected.

EXAM:
CT ABDOMEN AND PELVIS WITHOUT CONTRAST
TECHNIQUE: Multidetector CT imaging of the abdomen and pelvis was performed
following the standard protocol without IV contrast.

[Series 2: stone full standard · axial · 0.82mm/px · z∈[-1116,-682]mm · 13 of 97 slices shown, 15 images]
[im 5/97  soft-tissue]
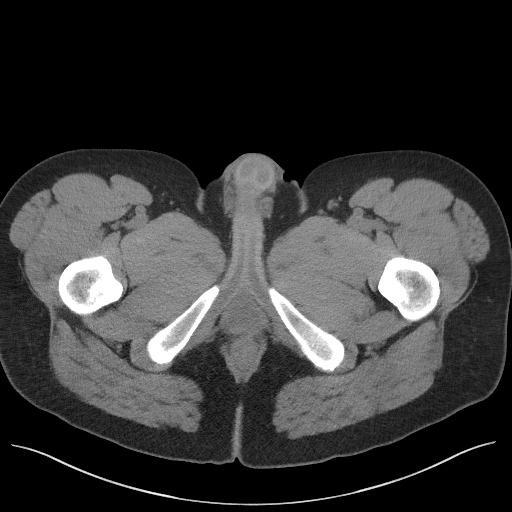
[im 5/97  bone]
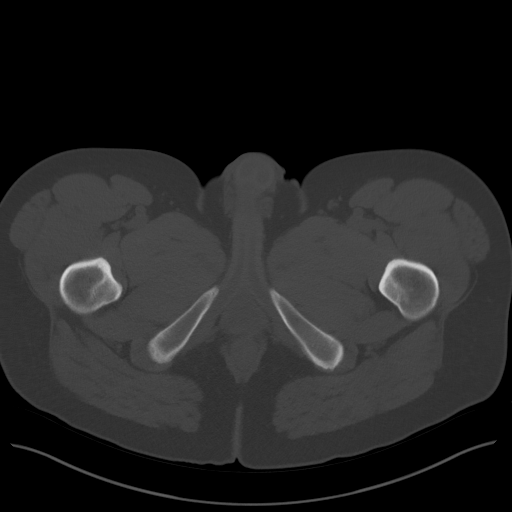
[im 13/97  soft-tissue]
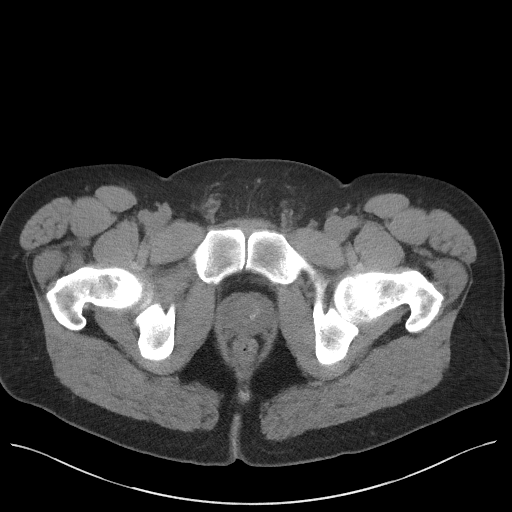
[im 21/97  soft-tissue]
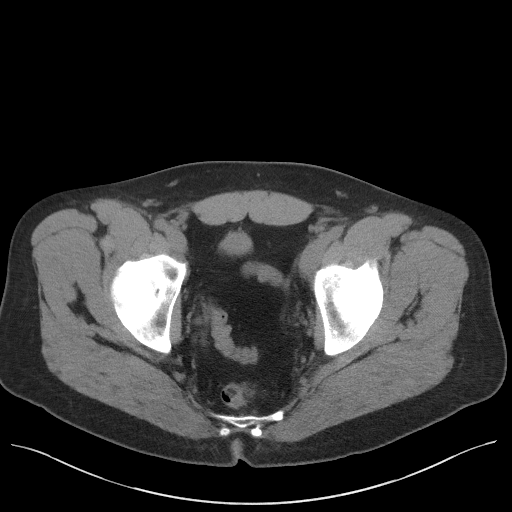
[im 26/97  soft-tissue]
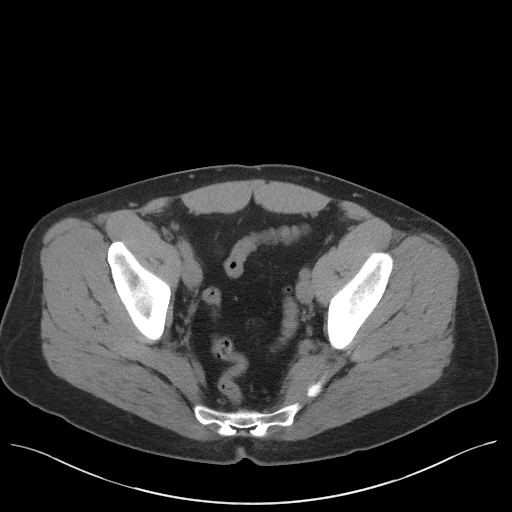
[im 34/97  soft-tissue]
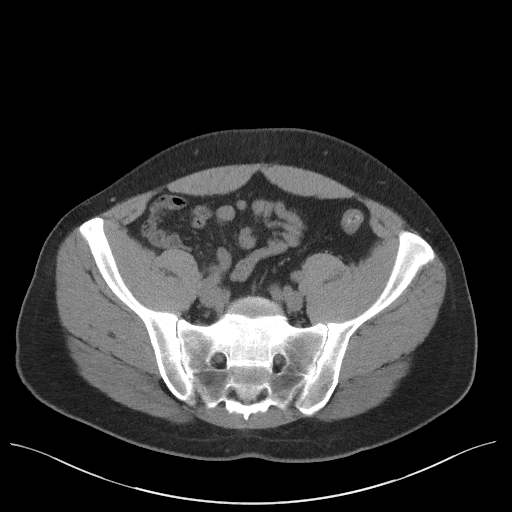
[im 42/97  soft-tissue]
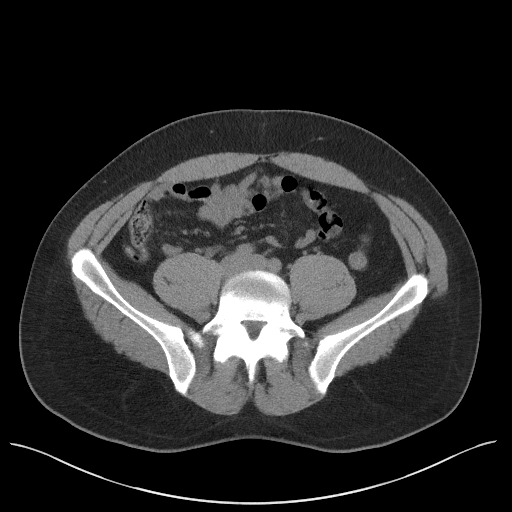
[im 51/97  soft-tissue]
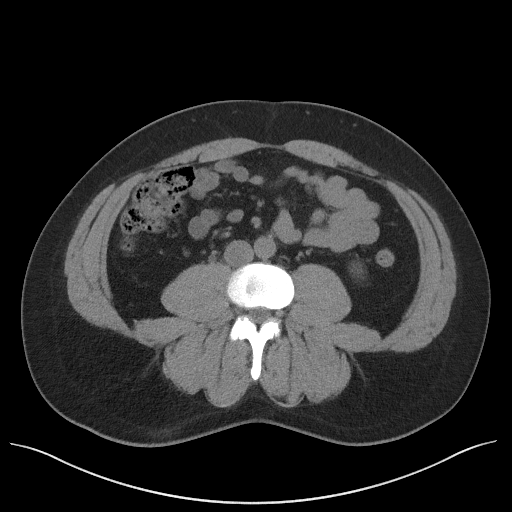
[im 55/97  soft-tissue]
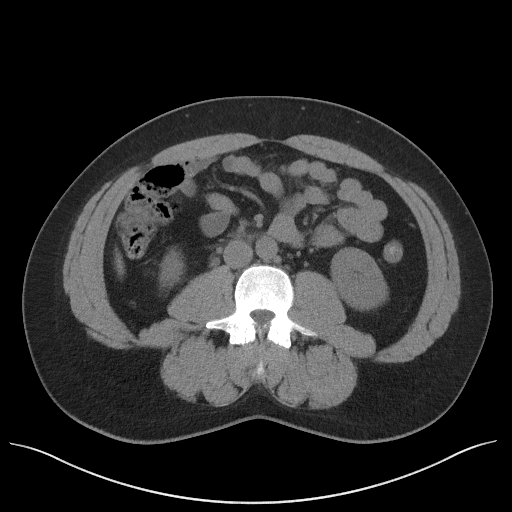
[im 63/97  soft-tissue]
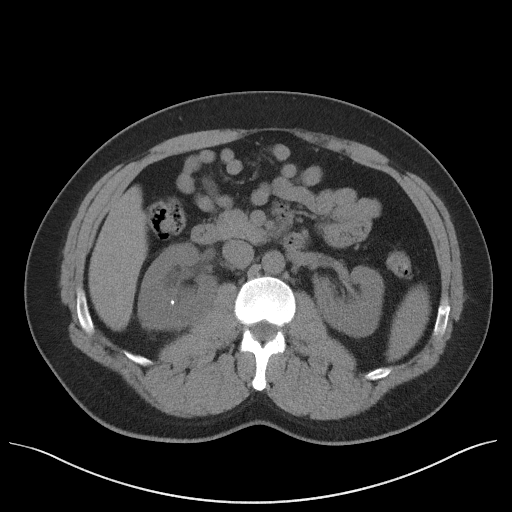
[im 63/97  bone]
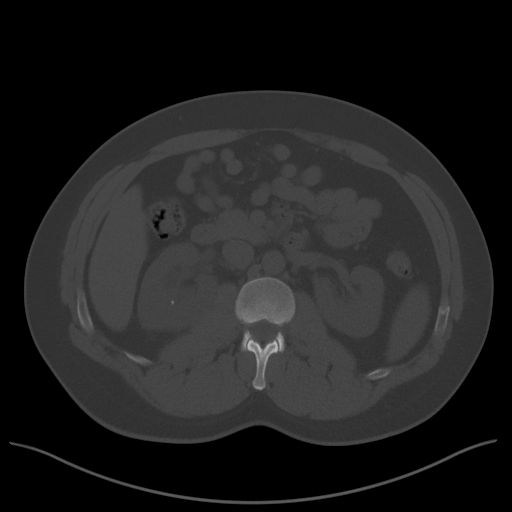
[im 71/97  soft-tissue]
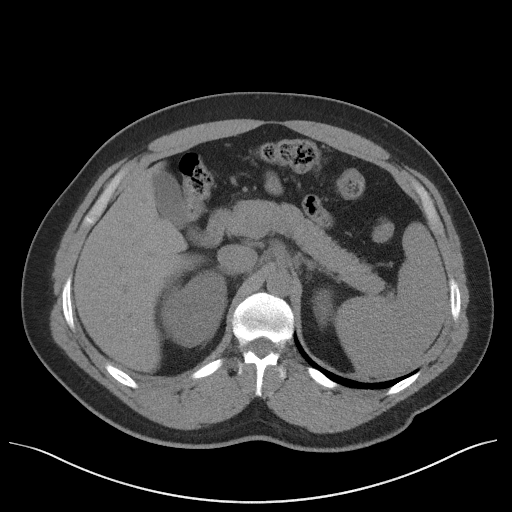
[im 76/97  soft-tissue]
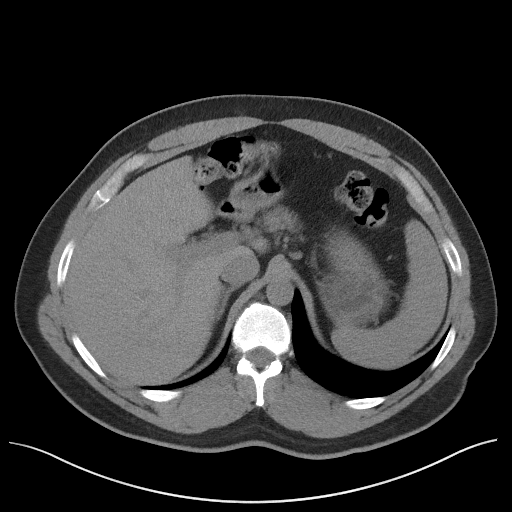
[im 84/97  soft-tissue]
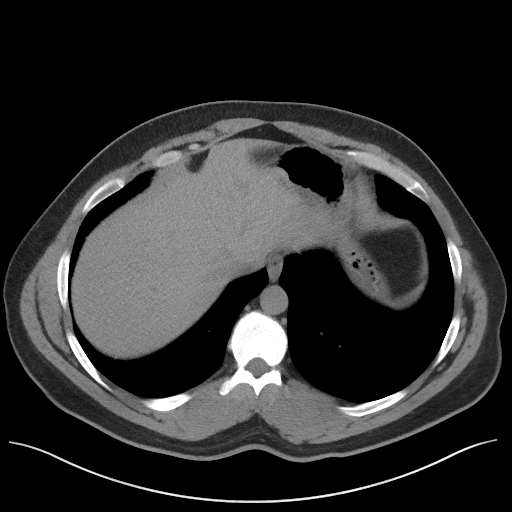
[im 92/97  soft-tissue]
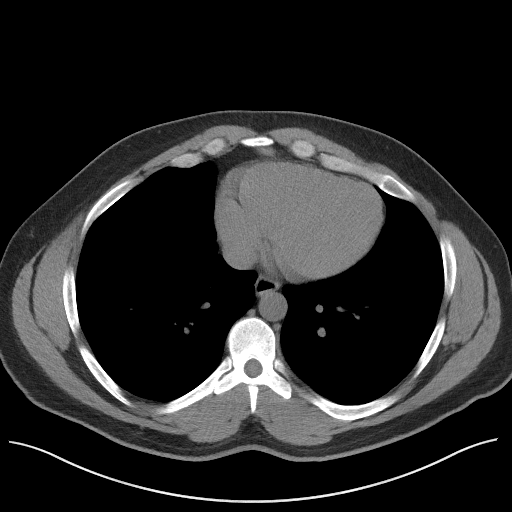

[Series 5: coronal · coronal · 0.74mm/px · 3 of 151 slices shown]
[im 51/151  soft-tissue]
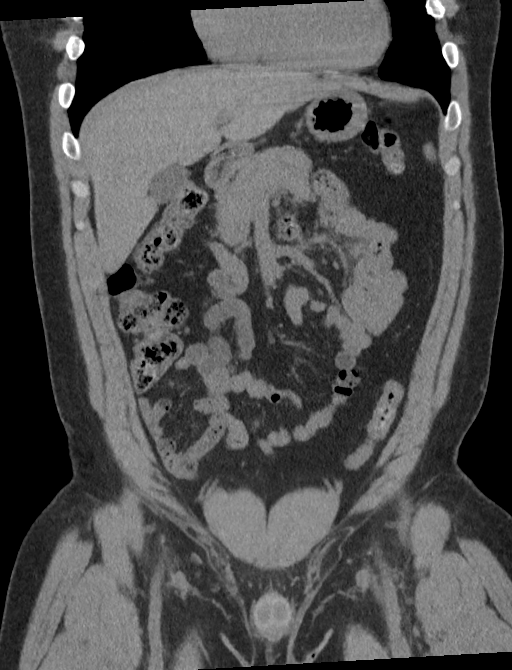
[im 67/151  soft-tissue]
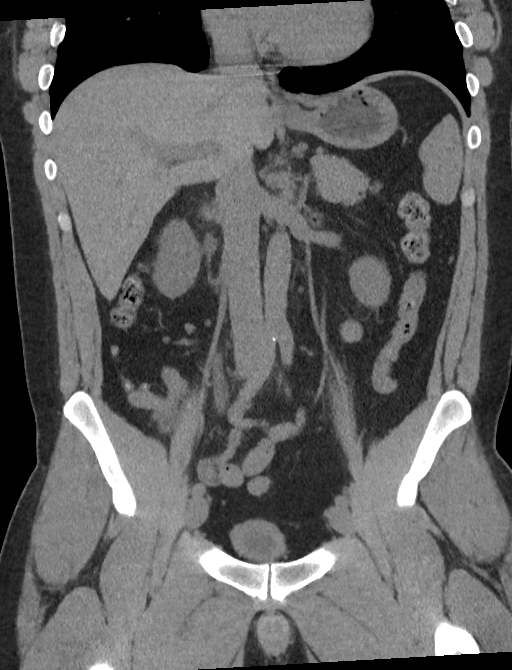
[im 84/151  soft-tissue]
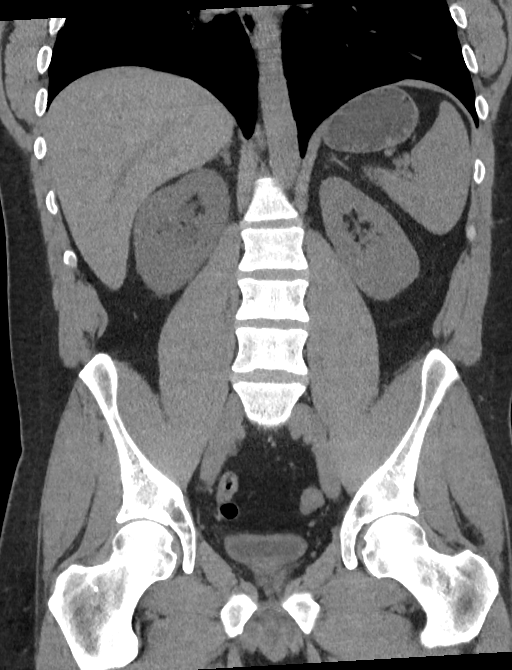

[16 of 46 positions shown; findings below may reference images not displayed]

FINDINGS: Lower chest: No acute abnormality.

Hepatobiliary: No focal liver abnormality is seen. No gallstones,
gallbladder wall thickening, or biliary dilatation.

Pancreas: Unremarkable. No pancreatic ductal dilatation or
surrounding inflammatory changes.

Spleen: Normal in size without focal abnormality.

Adrenals/Urinary Tract: Adrenal glands are normal. At least 3 tiny
stones are seen in the left kidney without hydronephrosis or
perinephric stranding. Two stones are seen in the right kidney. The
largest measures between 3 and 4 mm on coronal image 88.
Hydronephrosis is seen on the right, mild to moderate. Mild
perinephric stranding is identified. Right ureterectasis and
periureteral stranding is noted. The proximal right ureteral stone
on the previous study has migrated into the distal right ureter,
just above the right UVJ measuring 4 mm. The left ureter is normal.
The bladder is decompressed but grossly unremarkable given lack of
distention.

Stomach/Bowel: Stomach and small bowel are normal. The colon is
normal. The appendix is normal.

Vascular/Lymphatic: No significant vascular findings are present. No
enlarged abdominal or pelvic lymph nodes.

Reproductive: Prostate is unremarkable.

Other: No abdominal wall hernia or abnormality. No abdominopelvic
ascites.

Musculoskeletal: No acute or significant osseous findings.
IMPRESSION: 1. There is a 4 mm stone in the distal right ureter. This stone was
seen proximally previously but is now located distally just above
the right UVJ. The distal ureteral stone results in mild to moderate
hydronephrosis, perinephric stranding, and ureterectasis.
2. Other renal stones as above.

## 2020-10-21 ENCOUNTER — Ambulatory Visit: Payer: Managed Care, Other (non HMO) | Admitting: Nurse Practitioner

## 2020-10-21 ENCOUNTER — Other Ambulatory Visit: Payer: Self-pay

## 2020-10-21 VITALS — BP 134/78 | HR 76 | Temp 98.0°F | Resp 16 | Ht 68.0 in | Wt 226.0 lb

## 2020-10-21 DIAGNOSIS — H6691 Otitis media, unspecified, right ear: Secondary | ICD-10-CM

## 2020-10-21 MED ORDER — AMOXICILLIN 500 MG PO CAPS: 500.0000 mg | ORAL_CAPSULE | Freq: Three times a day (TID) | ORAL | 0 refills | Status: DC

## 2020-10-21 NOTE — Progress Notes (Signed)
Subjective:    Patient ID: Dylan Choi, male    DOB: Mar 11, 1986, 35 y.o.   MRN: [id number]  HPI JOSEF TOURIGNY is a 35 y.o. male who presents to the [facility] with right ear pain. The pain started yesterday and today is worse. He describes the pain as pressure that causes occasional distorted sound. He denies cough, congestion or sinus pressure. He rates his pain as 1/10.   History reviewed. No pertinent past medical history. History reviewed. No pertinent surgical history. Current Outpatient Medications on File Prior to Visit  Medication Sig Dispense Refill  . beclomethasone (QVAR) 80 MCG/ACT inhaler Inhale 2 puffs into the lungs 2 (two) times daily.    Marland Kitchen ibuprofen (ADVIL) 800 MG tablet Take 800 mg by mouth every 8 (eight) hours as needed.    . tamsulosin (FLOMAX) 0.4 MG CAPS capsule Take 1 capsule by mouth daily. Take 30 minutes after same meal each day (Patient not taking: Reported on 06/09/2020)     No current facility-administered medications on file prior to visit.   No Known Allergies   Review of Systems  Constitutional: Negative for chills and fever.  HENT: Positive for ear pain. Negative for congestion, sinus pain, sore throat and trouble swallowing.   Eyes: Negative for discharge, redness and visual disturbance.  Respiratory: Negative for cough and shortness of breath.   Cardiovascular: Negative for chest pain.  Gastrointestinal: Negative for abdominal pain, diarrhea, nausea and vomiting.  Musculoskeletal: Negative for gait problem and neck stiffness.  Skin: Negative for rash.  Neurological: Negative for syncope and headaches.  Psychiatric/Behavioral: Negative for confusion. The patient is not nervous/anxious.        Objective: BP 134/78 (BP Location: Right Arm, Patient Position: Sitting, Cuff Size: Large)   Pulse 76   Temp 98 F (36.7 C) (Temporal)   Resp 16   Ht 5\' 8"  (1.727 m)   Wt 226 lb (102.5 kg)   SpO2 99%   BMI 34.36 kg/m     Physical  Exam Constitutional:      General: He is not in acute distress.    Appearance: Normal appearance. He is normal weight.  HENT:     Head: Normocephalic.     Jaw: No trismus.     Right Ear: Ear canal and external ear normal. No decreased hearing noted. No mastoid tenderness. Tympanic membrane is injected and erythematous.     Left Ear: Tympanic membrane, ear canal and external ear normal. No decreased hearing noted. No tenderness. No mastoid tenderness.  Cardiovascular:     Rate and Rhythm: Normal rate and regular rhythm.     Heart sounds: No murmur heard.   Pulmonary:     Effort: Pulmonary effort is normal.     Breath sounds: Normal breath sounds and air entry.  Musculoskeletal:     Cervical back: Neck supple. No muscular tenderness.  Lymphadenopathy:     Head:     Right side of head: No submental adenopathy.     Left side of head: No submental adenopathy.     Cervical: No cervical adenopathy.  Skin:    General: Skin is warm and dry.  Neurological:     General: No focal deficit present.     Mental Status: He is alert.  Psychiatric:        Attention and Perception: Attention and perception normal.        Mood and Affect: Mood normal.        Speech: Speech normal.  Behavior: Behavior normal.       Assessment & Plan:  Acute right otitis media - Plan: amoxicillin (AMOXIL) 500 MG capsule Patient to continue decongestants that he started today.  Discussed with the patient clinical findings and plan of care. Patient given the opportunity to ask questions. All questioned fully answered and patient agrees with plan. He will call return if any problems arise.

## 2020-10-21 NOTE — Patient Instructions (Signed)
Otitis Media, Adult  Otitis media is a condition in which the middle ear is red and swollen (inflamed) and full of fluid. The middle ear is the part of the ear that contains bones for hearing as well as air that helps send sounds to the brain. The condition usually goes away on its own. What are the causes? This condition is caused by a blockage in the eustachian tube. The eustachian tube connects the middle ear to the back of the nose. It normally allows air into the middle ear. The blockage is caused by fluid or swelling. Problems that can cause blockage include:  A cold or infection that affects the nose, mouth, or throat.  Allergies.  An irritant, such as tobacco smoke.  Adenoids that have become large. The adenoids are soft tissue located in the back of the throat, behind the nose and the roof of the mouth.  Growth or swelling in the upper part of the throat, just behind the nose (nasopharynx).  Damage to the ear caused by change in pressure. This is called barotrauma. What are the signs or symptoms? Symptoms of this condition include:  Ear pain.  Fever.  Problems with hearing.  Being tired.  Fluid leaking from the ear.  Ringing in the ear. How is this treated? This condition can go away on its own within 3-5 days. But if the condition is caused by bacteria or does not go away on its own, or if it keeps coming back, your doctor may:  Give you antibiotic medicines.  Give you medicines for pain. Follow these instructions at home:  Take over-the-counter and prescription medicines only as told by your doctor.  If you were prescribed an antibiotic medicine, take it as told by your doctor. Do not stop taking the antibiotic even if you start to feel better.  Keep all follow-up visits as told by your doctor. This is important. Contact a doctor if:  You have bleeding from your nose.  There is a lump on your neck.  You are not feeling better in 5 days.  You feel worse  instead of better. Get help right away if:  You have pain that is not helped with medicine.  You have swelling, redness, or pain around your ear.  You get a stiff neck.  You cannot move part of your face (paralysis).  You notice that the bone behind your ear hurts when you touch it.  You get a very bad headache. Summary  Otitis media means that the middle ear is red, swollen, and full of fluid.  This condition usually goes away on its own.  If the problem does not go away, treatment may be needed. You may be given medicines to treat the infection or to treat your pain.  If you were prescribed an antibiotic medicine, take it as told by your doctor. Do not stop taking the antibiotic even if you start to feel better.  Keep all follow-up visits as told by your doctor. This is important. This information is not intended to replace advice given to you by your health care provider. Make sure you discuss any questions you have with your health care provider. Document Revised: 07/30/2019 Document Reviewed: 07/30/2019 Elsevier Patient Education  2021 Elsevier Inc.  

## 2021-06-07 ENCOUNTER — Encounter: Payer: Self-pay | Admitting: Emergency Medicine

## 2021-06-07 ENCOUNTER — Emergency Department
Admission: EM | Admit: 2021-06-07 | Discharge: 2021-06-07 | Disposition: A | Discharge status: Home / Self Care | Payer: Worker's Compensation | Attending: Emergency Medicine | Admitting: Emergency Medicine

## 2021-06-07 ENCOUNTER — Emergency Department: Payer: Worker's Compensation

## 2021-06-07 DIAGNOSIS — Y9289 Other specified places as the place of occurrence of the external cause: Secondary | ICD-10-CM | POA: Insufficient documentation

## 2021-06-07 DIAGNOSIS — Y9389 Activity, other specified: Secondary | ICD-10-CM | POA: Insufficient documentation

## 2021-06-07 DIAGNOSIS — J45909 Unspecified asthma, uncomplicated: Secondary | ICD-10-CM | POA: Diagnosis not present

## 2021-06-07 DIAGNOSIS — Z7951 Long term (current) use of inhaled steroids: Secondary | ICD-10-CM | POA: Insufficient documentation

## 2021-06-07 DIAGNOSIS — W228XXA Striking against or struck by other objects, initial encounter: Secondary | ICD-10-CM | POA: Insufficient documentation

## 2021-06-07 DIAGNOSIS — M25571 Pain in right ankle and joints of right foot: Secondary | ICD-10-CM | POA: Diagnosis present

## 2021-06-07 MED ORDER — ONDANSETRON 4 MG PO TBDP: 4.0000 mg | ORAL_TABLET | Freq: Three times a day (TID) | ORAL | 0 refills | Status: AC | PRN

## 2021-06-07 MED ORDER — OXYCODONE-ACETAMINOPHEN 5-325 MG PO TABS
1.0000 | ORAL_TABLET | Freq: Once | ORAL | Status: AC
  Administered 2021-06-07: 1 via ORAL
  Filled 2021-06-07: qty 1

## 2021-06-07 MED ORDER — OXYCODONE-ACETAMINOPHEN 5-325 MG PO TABS: 1.0000 | ORAL_TABLET | Freq: Four times a day (QID) | ORAL | 0 refills | Status: AC | PRN

## 2021-06-07 NOTE — ED Notes (Signed)
Esignature pad not working in triage room 4
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
## 2021-06-07 NOTE — Discharge Instructions (Signed)
Please make follow-up appointment with Dr. Rosita Kea. Take stool softener to avoid constipation. You have been prescribed Percocet for pain.  You have also been prescribed a prescription of Zofran for nausea.
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
## 2021-06-12 ENCOUNTER — Other Ambulatory Visit: Payer: Self-pay | Admitting: Surgery

## 2021-06-13 ENCOUNTER — Ambulatory Visit: Payer: Worker's Compensation | Admitting: Anesthesiology

## 2021-06-13 ENCOUNTER — Other Ambulatory Visit: Payer: Self-pay

## 2021-06-13 ENCOUNTER — Ambulatory Visit: Payer: Worker's Compensation

## 2021-06-13 ENCOUNTER — Encounter
Admission: RE | Disposition: A | Discharge status: Home / Self Care | Payer: Self-pay | Source: Ambulatory Visit | Attending: Surgery

## 2021-06-13 ENCOUNTER — Ambulatory Visit
Admission: RE | Admit: 2021-06-13 | Discharge: 2021-06-13 | Disposition: A | Discharge status: Home / Self Care | Payer: Worker's Compensation | Source: Ambulatory Visit | Attending: Surgery | Admitting: Surgery

## 2021-06-13 ENCOUNTER — Encounter: Payer: Self-pay | Admitting: Surgery

## 2021-06-13 DIAGNOSIS — S86011S Strain of right Achilles tendon, sequela: Secondary | ICD-10-CM

## 2021-06-13 DIAGNOSIS — S86011A Strain of right Achilles tendon, initial encounter: Secondary | ICD-10-CM | POA: Insufficient documentation

## 2021-06-13 DIAGNOSIS — Z419 Encounter for procedure for purposes other than remedying health state, unspecified: Secondary | ICD-10-CM

## 2021-06-13 HISTORY — PX: ACHILLES TENDON SURGERY: SHX542

## 2021-06-13 SURGERY — REPAIR, TENDON, ACHILLES
Anesthesia: Regional | Site: Leg Lower | Laterality: Right

## 2021-06-13 MED ORDER — CEFAZOLIN SODIUM-DEXTROSE 2-4 GM/100ML-% IV SOLN
2.0000 g | INTRAVENOUS | Status: AC
  Administered 2021-06-13: 2 g via INTRAVENOUS

## 2021-06-13 MED ORDER — FENTANYL CITRATE (PF) 100 MCG/2ML IJ SOLN
INTRAMUSCULAR | Status: DC | PRN
  Administered 2021-06-13 (×2): 50 ug via INTRAVENOUS

## 2021-06-13 MED ORDER — BUPIVACAINE HCL 0.5 % IJ SOLN
INTRAMUSCULAR | Status: DC | PRN
  Administered 2021-06-13: 7.5 mL

## 2021-06-13 MED ORDER — FAMOTIDINE 20 MG PO TABS: 20.0000 mg | ORAL_TABLET | Freq: Once | ORAL | Status: AC

## 2021-06-13 MED ORDER — OXYCODONE HCL 5 MG/5ML PO SOLN: 5.0000 mg | Freq: Once | ORAL | Status: DC | PRN

## 2021-06-13 MED ORDER — SUGAMMADEX SODIUM 200 MG/2ML IV SOLN
INTRAVENOUS | Status: DC | PRN
  Administered 2021-06-13: 200 mg via INTRAVENOUS

## 2021-06-13 MED ORDER — BUPIVACAINE LIPOSOME 1.3 % IJ SUSP
INTRAMUSCULAR | Status: DC | PRN
  Administered 2021-06-13: 7.5 mL

## 2021-06-13 MED ORDER — ACETAMINOPHEN 10 MG/ML IV SOLN
1000.0000 mg | Freq: Once | INTRAVENOUS | Status: DC | PRN
  Administered 2021-06-13: 1000 mg via INTRAVENOUS

## 2021-06-13 MED ORDER — FENTANYL CITRATE (PF) 100 MCG/2ML IJ SOLN: 25.0000 ug | INTRAMUSCULAR | Status: DC | PRN

## 2021-06-13 MED ORDER — OXYCODONE HCL 5 MG PO TABS: 5.0000 mg | ORAL_TABLET | Freq: Once | ORAL | Status: DC | PRN

## 2021-06-13 MED ORDER — MIDAZOLAM HCL 2 MG/2ML IJ SOLN
1.0000 mg | INTRAMUSCULAR | Status: AC | PRN
  Administered 2021-06-13: 1 mg via INTRAVENOUS

## 2021-06-13 MED ORDER — ROCURONIUM BROMIDE 100 MG/10ML IV SOLN
INTRAVENOUS | Status: DC | PRN
  Administered 2021-06-13: 10 mg via INTRAVENOUS
  Administered 2021-06-13: 30 mg via INTRAVENOUS
  Administered 2021-06-13: 20 mg via INTRAVENOUS

## 2021-06-13 MED ORDER — ONDANSETRON HCL 4 MG PO TABS: 4.0000 mg | ORAL_TABLET | Freq: Four times a day (QID) | ORAL | Status: DC | PRN

## 2021-06-13 MED ORDER — FENTANYL CITRATE PF 50 MCG/ML IJ SOSY: 50.0000 ug | PREFILLED_SYRINGE | Freq: Once | INTRAMUSCULAR | Status: AC

## 2021-06-13 MED ORDER — SODIUM CHLORIDE 0.9 % IV SOLN: INTRAVENOUS | Status: DC

## 2021-06-13 MED ORDER — BUPIVACAINE LIPOSOME 1.3 % IJ SUSP
INTRAMUSCULAR | Status: AC
  Filled 2021-06-13: qty 20

## 2021-06-13 MED ORDER — OXYCODONE HCL 5 MG PO TABS: 5.0000 mg | ORAL_TABLET | ORAL | Status: DC | PRN

## 2021-06-13 MED ORDER — KETOROLAC TROMETHAMINE 15 MG/ML IJ SOLN
INTRAMUSCULAR | Status: DC | PRN
Start: 2021-06-13 — End: 2021-06-13
  Administered 2021-06-13: 15 mg via INTRAVENOUS

## 2021-06-13 MED ORDER — CHLORHEXIDINE GLUCONATE 0.12 % MT SOLN: 15.0000 mL | Freq: Once | OROMUCOSAL | Status: AC

## 2021-06-13 MED ORDER — BUPIVACAINE HCL (PF) 0.5 % IJ SOLN
INTRAMUSCULAR | Status: AC
  Filled 2021-06-13: qty 20

## 2021-06-13 MED ORDER — CHLORHEXIDINE GLUCONATE 0.12 % MT SOLN
OROMUCOSAL | Status: AC
  Administered 2021-06-13: 15 mL via OROMUCOSAL
  Filled 2021-06-13: qty 15

## 2021-06-13 MED ORDER — LIDOCAINE HCL URETHRAL/MUCOSAL 2 % EX GEL
CUTANEOUS | Status: DC | PRN
  Administered 2021-06-13: 1 via TOPICAL

## 2021-06-13 MED ORDER — KETAMINE HCL 50 MG/ML IJ SOLN
INTRAMUSCULAR | Status: DC | PRN
  Administered 2021-06-13: 30 mg via INTRAMUSCULAR

## 2021-06-13 MED ORDER — ONDANSETRON HCL 4 MG/2ML IJ SOLN
INTRAMUSCULAR | Status: AC
  Administered 2021-06-13: 4 mg via INTRAVENOUS
  Filled 2021-06-13: qty 2

## 2021-06-13 MED ORDER — MIDAZOLAM HCL 2 MG/2ML IJ SOLN
INTRAMUSCULAR | Status: AC
  Administered 2021-06-13: 1 mg via INTRAVENOUS
  Filled 2021-06-13: qty 2

## 2021-06-13 MED ORDER — METOCLOPRAMIDE HCL 5 MG/ML IJ SOLN
5.0000 mg | Freq: Three times a day (TID) | INTRAMUSCULAR | Status: DC | PRN
Start: 2021-06-13 — End: 2021-06-14

## 2021-06-13 MED ORDER — MIDAZOLAM HCL 2 MG/2ML IJ SOLN
INTRAMUSCULAR | Status: AC
  Filled 2021-06-13: qty 2

## 2021-06-13 MED ORDER — SODIUM CHLORIDE FLUSH 0.9 % IV SOLN
INTRAVENOUS | Status: AC
  Filled 2021-06-13: qty 10

## 2021-06-13 MED ORDER — ONDANSETRON HCL 4 MG/2ML IJ SOLN: 4.0000 mg | Freq: Once | INTRAMUSCULAR | Status: DC | PRN

## 2021-06-13 MED ORDER — OXYCODONE HCL 5 MG PO TABS: 5.0000 mg | ORAL_TABLET | ORAL | 0 refills | Status: DC | PRN

## 2021-06-13 MED ORDER — DEXAMETHASONE SODIUM PHOSPHATE 10 MG/ML IJ SOLN
INTRAMUSCULAR | Status: DC | PRN
  Administered 2021-06-13: 10 mg via INTRAVENOUS

## 2021-06-13 MED ORDER — BUPIVACAINE HCL (PF) 0.5 % IJ SOLN
INTRAMUSCULAR | Status: AC
  Filled 2021-06-13: qty 30

## 2021-06-13 MED ORDER — LACTATED RINGERS IV SOLN: INTRAVENOUS | Status: DC

## 2021-06-13 MED ORDER — ONDANSETRON HCL 4 MG/2ML IJ SOLN
INTRAMUSCULAR | Status: DC | PRN
  Administered 2021-06-13: 4 mg via INTRAVENOUS

## 2021-06-13 MED ORDER — ACETAMINOPHEN 10 MG/ML IV SOLN
INTRAVENOUS | Status: AC
  Filled 2021-06-13: qty 100

## 2021-06-13 MED ORDER — SUCCINYLCHOLINE CHLORIDE 200 MG/10ML IV SOSY
PREFILLED_SYRINGE | INTRAVENOUS | Status: DC | PRN
  Administered 2021-06-13: 140 mg via INTRAVENOUS

## 2021-06-13 MED ORDER — FENTANYL CITRATE (PF) 100 MCG/2ML IJ SOLN
INTRAMUSCULAR | Status: AC
  Filled 2021-06-13: qty 2

## 2021-06-13 MED ORDER — KETAMINE HCL 50 MG/5ML IJ SOSY
PREFILLED_SYRINGE | INTRAMUSCULAR | Status: AC
  Filled 2021-06-13: qty 5

## 2021-06-13 MED ORDER — BUPIVACAINE HCL (PF) 0.5 % IJ SOLN
INTRAMUSCULAR | Status: DC | PRN
  Administered 2021-06-13: 30 mL

## 2021-06-13 MED ORDER — FAMOTIDINE 20 MG PO TABS
ORAL_TABLET | ORAL | Status: AC
  Administered 2021-06-13: 20 mg via ORAL
  Filled 2021-06-13: qty 1

## 2021-06-13 MED ORDER — METOCLOPRAMIDE HCL 10 MG PO TABS: 5.0000 mg | ORAL_TABLET | Freq: Three times a day (TID) | ORAL | Status: DC | PRN

## 2021-06-13 MED ORDER — CEFAZOLIN SODIUM-DEXTROSE 2-4 GM/100ML-% IV SOLN
INTRAVENOUS | Status: AC
  Filled 2021-06-13: qty 100

## 2021-06-13 MED ORDER — KETOROLAC TROMETHAMINE 15 MG/ML IJ SOLN
INTRAMUSCULAR | Status: AC
  Filled 2021-06-13: qty 1

## 2021-06-13 MED ORDER — PROPOFOL 10 MG/ML IV BOLUS
INTRAVENOUS | Status: DC | PRN
  Administered 2021-06-13: 250 mg via INTRAVENOUS

## 2021-06-13 MED ORDER — ORAL CARE MOUTH RINSE: 15.0000 mL | Freq: Once | OROMUCOSAL | Status: AC

## 2021-06-13 MED ORDER — ONDANSETRON HCL 4 MG/2ML IJ SOLN: 4.0000 mg | Freq: Four times a day (QID) | INTRAMUSCULAR | Status: DC | PRN

## 2021-06-13 MED ORDER — FENTANYL CITRATE PF 50 MCG/ML IJ SOSY
PREFILLED_SYRINGE | INTRAMUSCULAR | Status: AC
  Administered 2021-06-13: 50 ug via INTRAVENOUS
  Filled 2021-06-13: qty 1

## 2021-06-13 SURGICAL SUPPLY — 50 items
APL PRP STRL LF DISP 70% ISPRP (MISCELLANEOUS) ×2
BLADE CLIPPER SURG (BLADE) ×2 IMPLANT
BNDG COHESIVE 4X5 TAN ST LF (GAUZE/BANDAGES/DRESSINGS) ×2 IMPLANT
BNDG ELASTIC 4X5.8 VLCR STR LF (GAUZE/BANDAGES/DRESSINGS) ×2 IMPLANT
BNDG ELASTIC 6X5.8 VLCR STR LF (GAUZE/BANDAGES/DRESSINGS) ×4 IMPLANT
BNDG ESMARK 6X12 TAN STRL LF (GAUZE/BANDAGES/DRESSINGS) ×2 IMPLANT
CHLORAPREP W/TINT 26 (MISCELLANEOUS) ×4 IMPLANT
CUFF TOURN SGL QUICK 24 (TOURNIQUET CUFF)
CUFF TOURN SGL QUICK 34 (TOURNIQUET CUFF) ×2
CUFF TRNQT CYL 24X4X16.5-23 (TOURNIQUET CUFF) IMPLANT
CUFF TRNQT CYL 34X4.125X (TOURNIQUET CUFF) ×1 IMPLANT
DRAPE INCISE IOBAN 66X45 STRL (DRAPES) ×2 IMPLANT
ELECT CAUTERY BLADE 6.4 (BLADE) ×2 IMPLANT
ELECT REM PT RETURN 9FT ADLT (ELECTROSURGICAL) ×2
ELECTRODE REM PT RTRN 9FT ADLT (ELECTROSURGICAL) ×1 IMPLANT
GAUZE SPONGE 4X4 12PLY STRL (GAUZE/BANDAGES/DRESSINGS) ×2 IMPLANT
GAUZE XEROFORM 1X8 LF (GAUZE/BANDAGES/DRESSINGS) ×2 IMPLANT
GLOVE SURG ENC MOIS LTX SZ8 (GLOVE) ×4 IMPLANT
GLOVE SURG UNDER LTX SZ8 (GLOVE) ×2 IMPLANT
GOWN STRL REUS W/ TWL LRG LVL3 (GOWN DISPOSABLE) ×1 IMPLANT
GOWN STRL REUS W/ TWL XL LVL3 (GOWN DISPOSABLE) ×1 IMPLANT
GOWN STRL REUS W/TWL LRG LVL3 (GOWN DISPOSABLE) ×2
GOWN STRL REUS W/TWL XL LVL3 (GOWN DISPOSABLE) ×2
KIT TURNOVER KIT A (KITS) ×2 IMPLANT
MANIFOLD NEPTUNE II (INSTRUMENTS) ×2 IMPLANT
NS IRRIG 1000ML POUR BTL (IV SOLUTION) ×2 IMPLANT
PAD ABD DERMACEA PRESS 5X9 (GAUZE/BANDAGES/DRESSINGS) ×4 IMPLANT
PAD CAST CTTN 4X4 STRL (SOFTGOODS) ×3 IMPLANT
PADDING CAST COTTON 4X4 STRL (SOFTGOODS) ×6
SPLINT CAST 1 STEP 4X30 (MISCELLANEOUS) ×2 IMPLANT
SPLINT CAST 1 STEP 5X30 WHT (MISCELLANEOUS) ×2 IMPLANT
SPONGE T-LAP 18X18 ~~LOC~~+RFID (SPONGE) ×2 IMPLANT
STOCKINETTE M/LG 89821 (MISCELLANEOUS) ×2 IMPLANT
STRAP SAFETY 5IN WIDE (MISCELLANEOUS) ×2 IMPLANT
STRIP CLOSURE SKIN 1/4X4 (GAUZE/BANDAGES/DRESSINGS) IMPLANT
SUT ETHIBOND 5-0 MS/4 CCS GRN (SUTURE)
SUT FIBERWIRE #2 38 BLUE 1/2 (SUTURE) ×4
SUT FIBERWIRE #5 38 CONV BLUE (SUTURE) ×2
SUT PROLENE 4 0 PS 2 18 (SUTURE) ×6 IMPLANT
SUT TICRON 2-0 30IN 311381 (SUTURE) IMPLANT
SUT VIC AB 0 CT1 36 (SUTURE) IMPLANT
SUT VIC AB 1 CT1 36 (SUTURE) IMPLANT
SUT VIC AB 2-0 CT1 (SUTURE) ×2 IMPLANT
SUT VIC AB 2-0 SH 27 (SUTURE) ×2
SUT VIC AB 2-0 SH 27XBRD (SUTURE) ×1 IMPLANT
SUTURE ETHBND 5-0 MS/4 CCS GRN (SUTURE) IMPLANT
SUTURE FIBERWR #2 38 BLUE 1/2 (SUTURE) ×2 IMPLANT
SUTURE FIBERWR #5 38 CONV BLUE (SUTURE) ×1 IMPLANT
SWABSTK COMLB BENZOIN TINCTURE (MISCELLANEOUS) IMPLANT
WATER STERILE IRR 500ML POUR (IV SOLUTION) ×2 IMPLANT

## 2021-06-13 NOTE — Progress Notes (Signed)
Pt feeling nauseated also c/o  HA medicated appropriately. Wife at bedside.

## 2021-06-13 NOTE — Anesthesia Procedure Notes (Signed)
Date/Time: 06/13/2021 5:21 PM Performed by: Carter Kitten, CRNA Comments: Nasal airway
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
## 2021-06-13 NOTE — Discharge Instructions (Addendum)
Orthopedic discharge instructions: Keep splint dry and intact. Keep leg elevated above heart level. Apply ice to affected area frequently. Take ibuprofen 600-800 mg TID with meals for 7-10 days, then as necessary. Take pain medication as prescribed or ES Tylenol when needed.  Remain nonweightbearing on right lower leg and foot, using crutches or walker for ambulation. Return for follow-up in 10-14 days or as scheduled.AMBULATORY SURGERY  DISCHARGE INSTRUCTIONS   The drugs that you were given will stay in your system until tomorrow so for the next 24 hours you should not:  Drive an automobile Make any legal decisions Drink any alcoholic beverage   You may resume regular meals tomorrow.  Today it is better to start with liquids and gradually work up to solid foods.  You may eat anything you prefer, but it is better to start with liquids, then soup and crackers, and gradually work up to solid foods.   Please notify your doctor immediately if you have any unusual bleeding, trouble breathing, redness and pain at the surgery site, drainage, fever, or pain not relieved by medication.    Your post-operative visit with Dr.                                       is: Date:                        Time:    Please call to schedule your post-operative visit.  Additional Instructions:
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
## 2021-06-13 NOTE — Anesthesia Preprocedure Evaluation (Addendum)
Anesthesia Evaluation  Patient identified by MRN, date of birth, ID band Patient awake    Reviewed: Allergy & Precautions, NPO status , Patient's Chart, lab work & pertinent test results  History of Anesthesia Complications Negative for: history of anesthetic complications  Airway Mallampati: I   Neck ROM: Full    Dental no notable dental hx.    Pulmonary asthma ,    Pulmonary exam normal breath sounds clear to auscultation       Cardiovascular hypertension, Normal cardiovascular exam Rhythm:Regular Rate:Normal     Neuro/Psych negative neurological ROS     GI/Hepatic negative GI ROS,   Endo/Other  Obesity   Renal/GU Renal disease (nephrolithiasis)     Musculoskeletal   Abdominal   Peds  Hematology negative hematology ROS (+)   Anesthesia Other Findings   Reproductive/Obstetrics                            Anesthesia Physical Anesthesia Plan  ASA: 2  Anesthesia Plan: General and Regional   Post-op Pain Management:  Regional for Post-op pain and GA combined w/ Regional for post-op pain   Induction: Intravenous  PONV Risk Score and Plan: 2 and Ondansetron, Dexamethasone and Treatment may vary due to age or medical condition  Airway Management Planned: Oral ETT  Additional Equipment:   Intra-op Plan:   Post-operative Plan: Extubation in OR  Informed Consent: I have reviewed the patients History and Physical, chart, labs and discussed the procedure including the risks, benefits and alternatives for the proposed anesthesia with the patient or authorized representative who has indicated his/her understanding and acceptance.       Plan Discussed with: CRNA  Anesthesia Plan Comments:        Anesthesia Quick Evaluation

## 2021-06-13 NOTE — Anesthesia Procedure Notes (Signed)
Procedure Name: Intubation Date/Time: 06/13/2021 3:30 PM Performed by: Carter Kitten, CRNA Pre-anesthesia Checklist: Patient identified, Patient being monitored, Timeout performed, Emergency Drugs available and Suction available Patient Re-evaluated:Patient Re-evaluated prior to induction Oxygen Delivery Method: Circle system utilized Preoxygenation: Pre-oxygenation with 100% oxygen Induction Type: IV induction Ventilation: Mask ventilation without difficulty Laryngoscope Size: 4 and McGraph Grade View: Grade I Tube type: Oral Tube size: 7.0 mm Number of attempts: 1 Airway Equipment and Method: Stylet Placement Confirmation: ETT inserted through vocal cords under direct vision, positive ETCO2 and breath sounds checked- equal and bilateral Secured at: 22 cm Tube secured with: Tape Dental Injury: Teeth and Oropharynx as per pre-operative assessment

## 2021-06-13 NOTE — Anesthesia Procedure Notes (Signed)
Anesthesia Regional Block: Popliteal block   Pre-Anesthetic Checklist: , timeout performed,  Correct Patient, Correct Site, Correct Laterality,  Correct Procedure,, risks and benefits discussed,  Surgical consent,  Pre-op evaluation,  At surgeon's request and post-op pain management  Laterality: Right  Prep: chloraprep       Needles:  Injection technique: Single-shot  Needle Type: Stimiplex          Additional Needles:   Procedures:,,,, ultrasound used (permanent image in chart),,    Narrative:  Start time: 06/13/2021 2:37 PM End time: 06/13/2021 2:39 PM Injection made incrementally with aspirations every 5 mL.  Performed by: Personally  Anesthesiologist: Reed Breech, MD  Additional Notes: 30 ml bupivacaine 0.5% given in popliteal sciatic distribution.

## 2021-06-13 NOTE — Transfer of Care (Signed)
Immediate Anesthesia Transfer of Care Note  Patient: CLYDELL SPOSITO  Procedure(s) Performed: ACHILLES TENDON REPAIR (Right: Leg Lower)  Patient Location: PACU  Anesthesia Type:GA combined with regional for post-op pain  Level of Consciousness: awake  Airway & Oxygen Therapy: Patient Spontanous Breathing  Post-op Assessment: Report given to RN  Post vital signs: stable  Last Vitals:  Vitals Value Taken Time  BP 156/81 06/13/21 1730  Temp    Pulse 89 06/13/21 1734  Resp 15 06/13/21 1734  SpO2 100 % 06/13/21 1734  Vitals shown include unvalidated device data.  Last Pain:  Vitals:   06/13/21 1406  TempSrc: Temporal  PainSc: 5          Complications: No notable events documented.

## 2021-06-14 ENCOUNTER — Encounter: Payer: Self-pay | Admitting: Surgery

## 2021-06-19 NOTE — Anesthesia Postprocedure Evaluation (Signed)
Anesthesia Post Note  Patient: Dylan Choi  Procedure(s) Performed: ACHILLES TENDON REPAIR (Right: Leg Lower)  Patient location during evaluation: PACU Anesthesia Type: Regional Level of consciousness: awake and alert Pain management: pain level controlled Vital Signs Assessment: post-procedure vital signs reviewed and stable Respiratory status: spontaneous breathing, nonlabored ventilation, respiratory function stable and patient connected to nasal cannula oxygen Cardiovascular status: blood pressure returned to baseline and stable Postop Assessment: no apparent nausea or vomiting Anesthetic complications: no   No notable events documented.   Last Vitals:  Vitals:   06/13/21 1758 06/13/21 1946  BP: (!) 148/91 (!) 147/74  Pulse: 87 76  Resp: 20 18  Temp: 36.4 C   SpO2: 97%     Last Pain:  Vitals:   06/14/21 0913  TempSrc:   PainSc: 7                  Yevette Edwards

## 2021-12-24 IMAGING — CR DG ANKLE COMPLETE 3+V*R*
1 series · 3 of 3 positions shown · non-contrast
Comparison: None.

CLINICAL DATA: Right foot and ankle pain

EXAM:
RIGHT ANKLE - COMPLETE 3+ VIEW

[Series 1: x ankle ap right · 0.14mm/px · 3 of 3 slices shown]
[im 1/3]
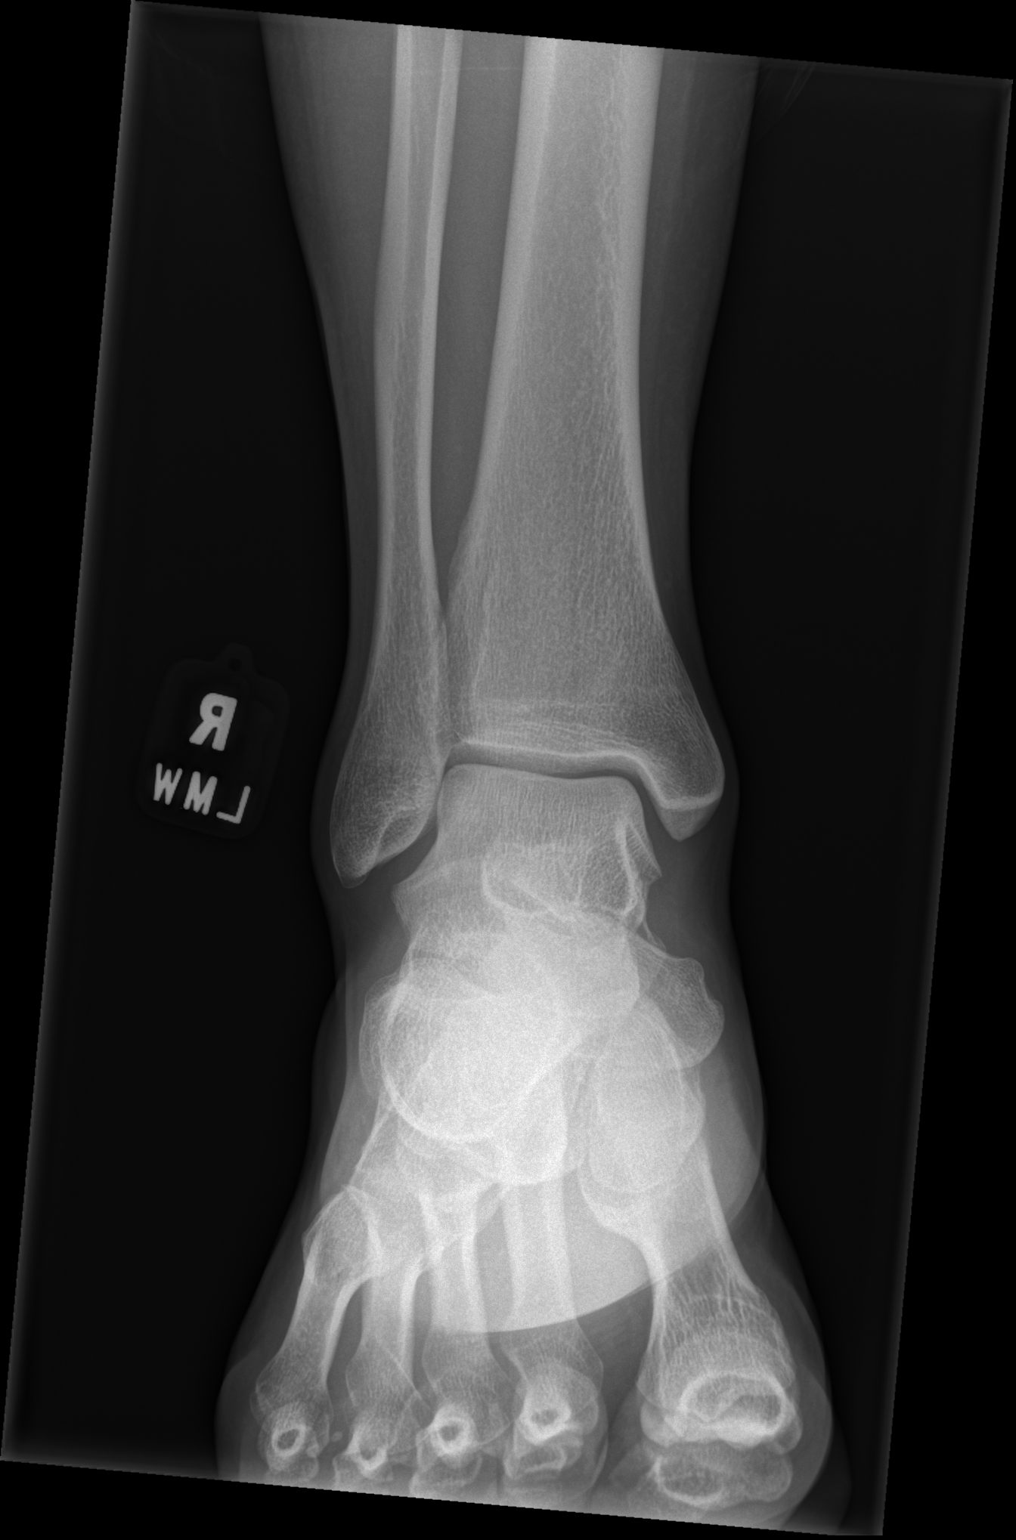
[im 2/3]
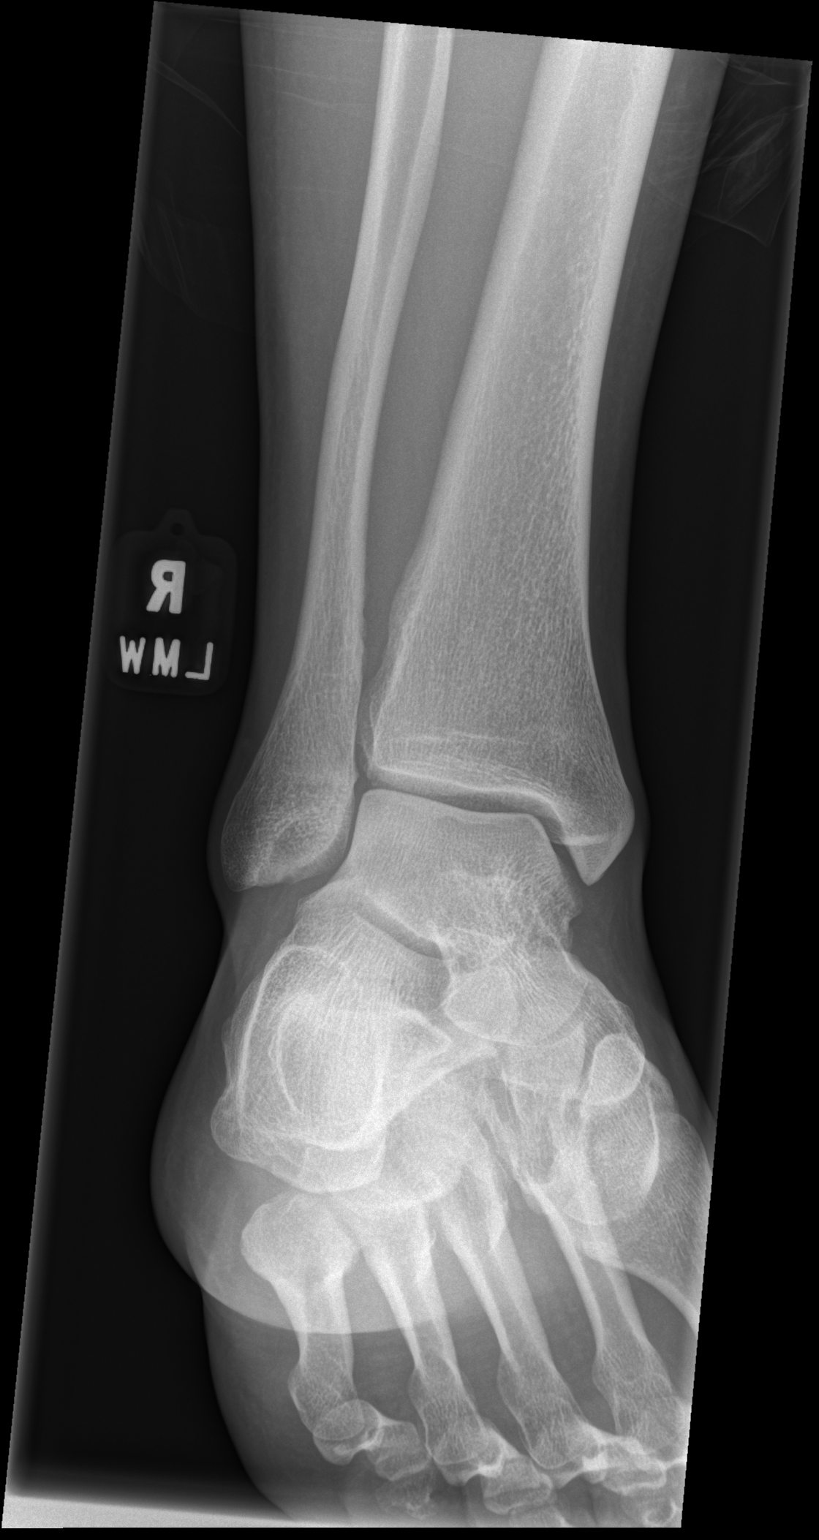
[im 3/3]
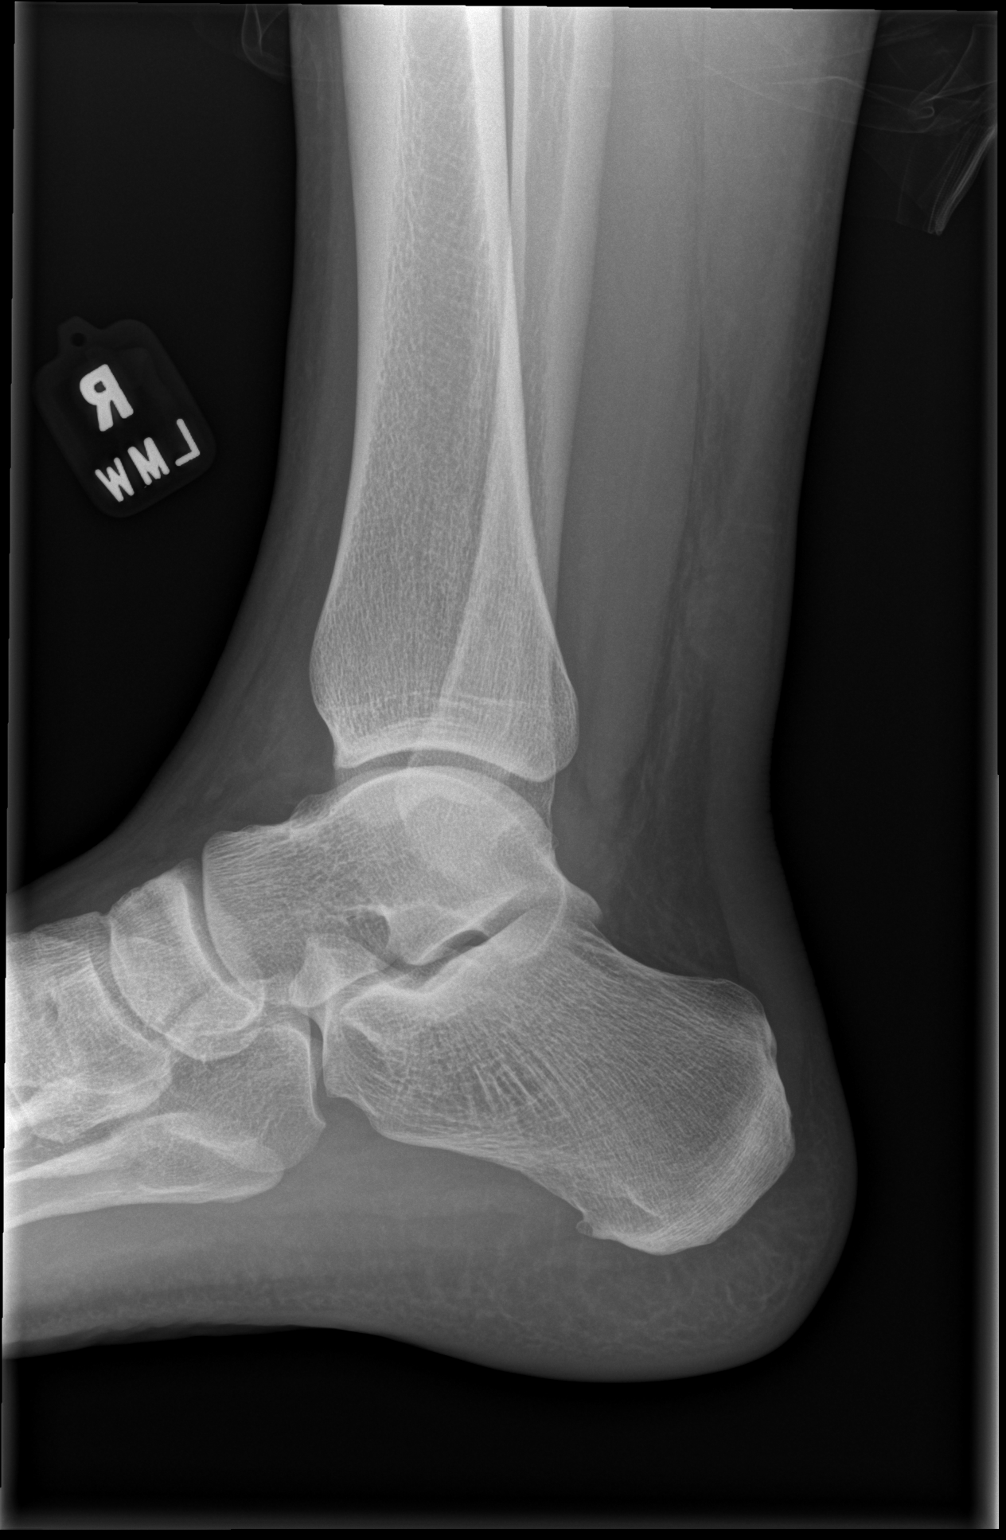

[3 of 3 positions shown; findings below may reference images not displayed]

FINDINGS: No acute fracture or dislocation. No aggressive osseous lesion.
Normal alignment. Tiny plantar calcaneal spur.

Soft tissue are unremarkable. No radiopaque foreign body or soft
tissue emphysema.
IMPRESSION: No acute osseous injury of the right ankle.

## 2023-04-10 ENCOUNTER — Other Ambulatory Visit: Payer: Self-pay | Admitting: Orthopedic Surgery

## 2023-04-10 DIAGNOSIS — M25311 Other instability, right shoulder: Secondary | ICD-10-CM

## 2023-04-10 DIAGNOSIS — S46911D Strain of unspecified muscle, fascia and tendon at shoulder and upper arm level, right arm, subsequent encounter: Secondary | ICD-10-CM

## 2023-04-29 ENCOUNTER — Ambulatory Visit
Admission: RE | Admit: 2023-04-29 | Discharge: 2023-04-29 | Disposition: A | Discharge status: Home / Self Care | Payer: Managed Care, Other (non HMO) | Source: Ambulatory Visit | Attending: Orthopedic Surgery | Admitting: Orthopedic Surgery

## 2023-04-29 DIAGNOSIS — M25311 Other instability, right shoulder: Secondary | ICD-10-CM

## 2023-04-29 DIAGNOSIS — S46911D Strain of unspecified muscle, fascia and tendon at shoulder and upper arm level, right arm, subsequent encounter: Secondary | ICD-10-CM

## 2023-04-29 MED ORDER — IOPAMIDOL (ISOVUE-M 200) INJECTION 41%
13.0000 mL | Freq: Once | INTRAMUSCULAR | Status: AC
  Administered 2023-04-29: 13 mL via INTRA_ARTICULAR

## 2023-06-14 ENCOUNTER — Other Ambulatory Visit: Payer: Self-pay | Admitting: Orthopedic Surgery

## 2023-06-17 ENCOUNTER — Encounter: Payer: Self-pay | Admitting: Orthopedic Surgery

## 2023-06-17 NOTE — Anesthesia Preprocedure Evaluation (Addendum)
Anesthesia Evaluation    History of Anesthesia Complications (+) PONV and history of anesthetic complications  Airway Mallampati: II  TM Distance: <3 FB Neck ROM: Full    Dental no notable dental hx.    Pulmonary asthma    Pulmonary exam normal breath sounds clear to auscultation       Cardiovascular Normal cardiovascular exam Rhythm:Regular Rate:Normal     Neuro/Psych    GI/Hepatic   Endo/Other    Renal/GU      Musculoskeletal   Abdominal   Peds  Hematology   Anesthesia Other Findings "Complication of anesthesia" was headache and nausea after achilles surgery  Dilaudid makes throat close up!!!!!!!!! States he has taken oxycodone and hydrocodone before  Reproductive/Obstetrics                             Anesthesia Physical Anesthesia Plan  ASA: 2  Anesthesia Plan: General ETT   Post-op Pain Management: Regional block   Induction: Intravenous  PONV Risk Score and Plan:   Airway Management Planned: Oral ETT  Additional Equipment:   Intra-op Plan:   Post-operative Plan: Extubation in OR  Informed Consent: I have reviewed the patients History and Physical, chart, labs and discussed the procedure including the risks, benefits and alternatives for the proposed anesthesia with the patient or authorized representative who has indicated his/her understanding and acceptance.     Dental Advisory Given  Plan Discussed with: Anesthesiologist, CRNA and Surgeon  Anesthesia Plan Comments: (Patient consented for risks of anesthesia including but not limited to:  - adverse reactions to medications - damage to eyes, teeth, lips or other oral mucosa - nerve damage due to positioning  - sore throat or hoarseness - Damage to heart, brain, nerves, lungs, other parts of body or loss of life  Patient voiced understanding and assent.)        Anesthesia Quick Evaluation

## 2023-06-20 NOTE — Discharge Instructions (Addendum)
Post-Op Instructions - Rotator Cuff Repair  1. Bracing: You will wear a shoulder immobilizer or sling for 6 weeks.   2. Driving: No driving for 3 weeks post-op. When driving, do not wear the immobilizer. Ideally, we recommend no driving for 6 weeks while sling is in place as one arm will be immobilized.   3. Activity: No active lifting for 2 months. Wrist, hand, and elbow motion only. Avoid lifting the upper arm away from the body except for hygiene. You are permitted to bend and straighten the elbow passively only (no active elbow motion). You may use your hand and wrist for typing, writing, and managing utensils (cutting food). Do not lift more than a coffee cup for 8 weeks.  When sleeping or resting, inclined positions (recliner chair or wedge pillow) and a pillow under the forearm for support may provide better comfort for up to 4 weeks.  Avoid long distance travel for 4 weeks.  Return to normal activities after rotator cuff repair repair normally takes 6 months on average. If rehab goes very well, may be able to do most activities at 4 months, except overhead or contact sports.  4. Physical Therapy: Begins 3-4 days after surgery, and proceed 1 time per week for the first 6 weeks, then 1-2 times per week from weeks 6-20 post-op.  5. Medications:  - You will be provided a prescription for narcotic pain medicine. After surgery, take 1-2 narcotic tablets every 4 hours if needed for severe pain.  - A prescription for anti-nausea medication will be provided in case the narcotic medicine causes nausea - take 1 tablet every 6 hours only if nauseated.   - Take tylenol 1000 mg (2 Extra Strength tablets or 3 regular strength) every 8 hours for pain.  May decrease or stop tylenol 5 days after surgery if you are having minimal pain. - Take ASA 325mg /day x 2 weeks to help prevent DVTs/PEs (blood clots).  - DO NOT take ANY nonsteroidal anti-inflammatory pain medications (Advil, Motrin, Ibuprofen, Aleve,  Naproxen, or Naprosyn). These medicines can inhibit healing of your shoulder repair.    If you are taking prescription medication for anxiety, depression, insomnia, muscle spasm, chronic pain, or for attention deficit disorder, you are advised that you are at a higher risk of adverse effects with use of narcotics post-op, including narcotic addiction/dependence, depressed breathing, death. If you use non-prescribed substances: alcohol, marijuana, cocaine, heroin, methamphetamines, etc., you are at a higher risk of adverse effects with use of narcotics post-op, including narcotic addiction/dependence, depressed breathing, death. You are advised that taking > 50 morphine milligram equivalents (MME) of narcotic pain medication per day results in twice the risk of overdose or death. For your prescription provided: oxycodone 5 mg - taking more than 6 tablets per day would result in > 50 morphine milligram equivalents (MME) of narcotic pain medication. Be advised that we will prescribe narcotics short-term, for acute post-operative pain only - 3 weeks for major operations such as shoulder repair/reconstruction surgeries.     6. Post-Op Appointment:  Your first post-op appointment will be 10-14 days post-op.  7. Work or School: For most, but not all procedures, we advise staying out of work or school for at least 1 to 2 weeks in order to recover from the stress of surgery and to allow time for healing.   If you need a work or school note this can be provided.   8. Smoking: If you are a smoker, you need to refrain from  smoking in the postoperative period. The nicotine in cigarettes will inhibit healing of your shoulder repair and decrease the chance of successful repair. Similarly, nicotine containing products (gum, patches) should be avoided.   Post-operative Brace: Apply and remove the brace you received as you were instructed to at the time of fitting and as described in detail as the brace's  instructions for use indicate.  Wear the brace for the period of time prescribed by your physician.  The brace can be cleaned with soap and water and allowed to air dry only.  Should the brace result in increased pain, decreased feeling (numbness/tingling), increased swelling or an overall worsening of your medical condition, please contact your doctor immediately.  If an emergency situation occurs as a result of wearing the brace after normal business hours, please dial 911 and seek immediate medical attention.  Let your doctor know if you have any further questions about the brace issued to you. Refer to the shoulder sling instructions for use if you have any questions regarding the correct fit of your shoulder sling.  [facility] Customer Care for Troubleshooting: [phone]  Video that illustrates how to properly use a shoulder sling: "Instructions for Proper Use of an Orthopaedic Sling" http://bass.com/       POLAR CARE INFORMATION  MassAdvertisement.it  How to use [employer] Polar Care [facility]?  YouTube   ShippingScam.co.uk  OPERATING INSTRUCTIONS  Start the product With dry hands, connect the transformer to the electrical connection located on the top of the cooler. Next, plug the transformer into an appropriate electrical outlet. The unit will automatically start running at this point.  To stop the pump, disconnect electrical power.  Unplug to stop the product when not in use. Unplugging the Polar Care unit turns it off. Always unplug immediately after use. Never leave it plugged in while unattended. Remove pad.    FIRST ADD WATER TO FILL LINE, THEN ICE---Replace ice when existing ice is almost melted  1 Discuss Treatment with your Licensed Health Care Practitioner and Use Only as Prescribed 2 Apply Insulation Barrier & Cold Therapy Pad 3 Check for Moisture 4 Inspect Skin Regularly  Tips and Trouble Shooting Usage  Tips 1. Use cubed or chunked ice for optimal performance. 2. It is recommended to drain the Pad between uses. To drain the pad, hold the Pad upright with the hose pointed toward the ground. Depress the black plunger and allow water to drain out. 3. You may disconnect the Pad from the unit without removing the pad from the affected area by depressing the silver tabs on the hose coupling and gently pulling the hoses apart. The Pad and unit will seal itself and will not leak. Note: Some dripping during release is normal. 4. DO NOT RUN PUMP WITHOUT WATER! The pump in this unit is designed to run with water. Running the unit without water will cause permanent damage to the pump. 5. Unplug unit before removing lid.  TROUBLESHOOTING GUIDE Pump not running, Water not flowing to the pad, Pad is not getting cold 1. Make sure the transformer is plugged into the wall outlet. 2. Confirm that the ice and water are filled to the indicated levels. 3. Make sure there are no kinks in the pad. 4. Gently pull on the blue tube to make sure the tube/pad junction is straight. 5. Remove the pad from the treatment site and ll it while the pad is lying at; then reapply. 6. Confirm that the pad couplings are securely  attached to the unit. Listen for the double clicks (Figure 1) to confirm the pad couplings are securely attached.  Leaks    Note: Some condensation on the lines, controller, and pads is unavoidable, especially in warmer climates. 1. If using a [employer] Polar Care Cold Therapy unit with a detachable Cold Therapy Pad, and a leak exists (other than condensation on the lines) disconnect the pad couplings. Make sure the silver tabs on the couplings are depressed before reconnecting the pad to the pump hose; then confirm both sides of the coupling are properly clicked in. 2. If the coupling continues to leak or a leak is detected in the pad itself, stop using it and call [employer] Customer Care at [phone]  [phone].  Cleaning After use, empty and dry the unit with a soft cloth. Warm water and mild detergent may be used occasionally to clean the pump and tubes.  WARNING: The Polar Care Cube can be cold enough to cause serious injury, including full skin necrosis. Follow these Operating Instructions, and carefully read the Product Insert (see pouch on side of unit) and the Cold Therapy Pad Fitting Instructions (provided with each Cold Therapy Pad) prior to use.       PERIPHERAL NERVE BLOCK PATIENT INFORMATION  Your surgeon has requested a peripheral nerve block for your surgery. This anesthetic technique provides excellent post-operative pain relief for you in a safe and effective manner. It will also help reduce the risk of nausea and vomiting and allow earlier discharge from the hospital.   The block is performed under sedation with ultrasound guidance prior to your procedure. Due to the sedation, your may or may not remember the block experience. The nerve block will begin to take effect anywhere from 5 to 30 minutes after being administered. You will be transported to the operating room from your surgery after the block is completed.   At the end of surgery, when the anesthesia wears off, you will notice a few things. Your may not be able to move or feel the part of your body targeted by the nerve block. These are normal experiences, and they will disappear as the block wears off.  If you had an interscalene nerve block performed (which is common for shoulder surgery), your voice can be very hoarse and you may feel that you are not able to take as deep a breath as you did before surgery. Some patients may also notice a droopy eyelid on the affected side. These symptoms will resolve once the block wears off.  Pain control: The nerve block technique used is a single injection that can last anywhere from 1-3 days. The duration of the numbness can vary between individuals. After leaving the hospital,  it is important that you begin to take your prescribed pain medication when you start to sense the nerve block wearing off. This will help you avoid unpleasant pain at the time the nerve block wears off, which can sometimes be in the middle of the night. The block will only cover pain in the areas targeted by the nerve block so if you experience surgical pain outside of that area, please take your prescribed pain medication. Management of the "numb area": After a nerve block, you cannot feel pain, pressure, or temperature in the affected area so there is an increased risk for injury. You should take extra care to protect the affected areas until sensation and movement returns. Please take caution to not come in contact with extremely hot or cold  items because you will not be able to sense or protect yourself form the extremes of temperature.  You may experience some persistent numbness after the procedure by most neurological deficits resolve over time and the incidence of serious long term neurological complications attributable to peripheral nerve blocks are relatively uncommon.

## 2023-06-21 ENCOUNTER — Ambulatory Visit: Payer: Managed Care, Other (non HMO) | Admitting: Anesthesiology

## 2023-06-21 ENCOUNTER — Other Ambulatory Visit: Payer: Self-pay

## 2023-06-21 ENCOUNTER — Encounter
Admission: RE | Disposition: A | Discharge status: Home / Self Care | Payer: Self-pay | Source: Home / Self Care | Attending: Orthopedic Surgery

## 2023-06-21 ENCOUNTER — Ambulatory Visit
Admission: RE | Admit: 2023-06-21 | Discharge: 2023-06-21 | Disposition: A | Discharge status: Home / Self Care | Payer: Managed Care, Other (non HMO) | Attending: Orthopedic Surgery | Admitting: Orthopedic Surgery

## 2023-06-21 ENCOUNTER — Encounter: Payer: Self-pay | Admitting: Orthopedic Surgery

## 2023-06-21 DIAGNOSIS — J45909 Unspecified asthma, uncomplicated: Secondary | ICD-10-CM | POA: Diagnosis not present

## 2023-06-21 DIAGNOSIS — X58XXXA Exposure to other specified factors, initial encounter: Secondary | ICD-10-CM | POA: Diagnosis not present

## 2023-06-21 DIAGNOSIS — M75101 Unspecified rotator cuff tear or rupture of right shoulder, not specified as traumatic: Secondary | ICD-10-CM | POA: Insufficient documentation

## 2023-06-21 DIAGNOSIS — S43431A Superior glenoid labrum lesion of right shoulder, initial encounter: Secondary | ICD-10-CM | POA: Insufficient documentation

## 2023-06-21 HISTORY — DX: Unspecified asthma, uncomplicated: J45.909

## 2023-06-21 HISTORY — DX: Other complications of anesthesia, initial encounter: T88.59XA

## 2023-06-21 HISTORY — PX: SHOULDER ARTHROSCOPY: SHX128

## 2023-06-21 HISTORY — DX: Other specified health status: Z78.9

## 2023-06-21 HISTORY — DX: Other specified postprocedural states: Z98.890

## 2023-06-21 HISTORY — PX: ARTHOSCOPIC ROTAOR CUFF REPAIR: SHX5002

## 2023-06-21 HISTORY — PX: LABRAL REPAIR: SHX5172

## 2023-06-21 SURGERY — ARTHROSCOPY, SHOULDER
Anesthesia: Choice | Site: Shoulder | Laterality: Right

## 2023-06-21 MED ORDER — DEXAMETHASONE SODIUM PHOSPHATE 10 MG/ML IJ SOLN
INTRAMUSCULAR | Status: AC
  Filled 2023-06-21: qty 1

## 2023-06-21 MED ORDER — EPINEPHRINE PF 1 MG/ML IJ SOLN
INTRAMUSCULAR | Status: DC | PRN
  Administered 2023-06-21: 4 mL

## 2023-06-21 MED ORDER — BUPIVACAINE HCL (PF) 0.5 % IJ SOLN
INTRAMUSCULAR | Status: DC | PRN
Start: 2023-06-21 — End: 2023-06-21
  Administered 2023-06-21: 25 mL via PERINEURAL

## 2023-06-21 MED ORDER — FENTANYL CITRATE (PF) 100 MCG/2ML IJ SOLN
100.0000 ug | Freq: Once | INTRAMUSCULAR | Status: AC
  Administered 2023-06-21: 100 ug via INTRAVENOUS

## 2023-06-21 MED ORDER — ONDANSETRON HCL 4 MG/2ML IJ SOLN
INTRAMUSCULAR | Status: AC
  Filled 2023-06-21: qty 2

## 2023-06-21 MED ORDER — BUPIVACAINE LIPOSOME 1.3 % IJ SUSP
INTRAMUSCULAR | Status: AC
  Filled 2023-06-21: qty 10

## 2023-06-21 MED ORDER — SODIUM CHLORIDE 0.9% FLUSH
10.0000 mL | INTRAVENOUS | Status: DC | PRN
  Administered 2023-06-21: 10 mL via INTRAVENOUS

## 2023-06-21 MED ORDER — ONDANSETRON 4 MG PO TBDP: 4.0000 mg | ORAL_TABLET | Freq: Three times a day (TID) | ORAL | 0 refills | Status: DC | PRN

## 2023-06-21 MED ORDER — OXYCODONE HCL 5 MG PO TABS
5.0000 mg | ORAL_TABLET | ORAL | 0 refills | Status: DC | PRN
Start: 2023-06-21 — End: 2023-08-23

## 2023-06-21 MED ORDER — ACETAMINOPHEN 10 MG/ML IV SOLN
INTRAVENOUS | Status: AC
  Filled 2023-06-21: qty 100

## 2023-06-21 MED ORDER — CEFAZOLIN SODIUM-DEXTROSE 2-3 GM-%(50ML) IV SOLR
INTRAVENOUS | Status: AC
  Filled 2023-06-21: qty 50

## 2023-06-21 MED ORDER — BUPIVACAINE HCL (PF) 0.5 % IJ SOLN
INTRAMUSCULAR | Status: AC
  Filled 2023-06-21: qty 10

## 2023-06-21 MED ORDER — SCOPOLAMINE 1 MG/3DAYS TD PT72
1.0000 | MEDICATED_PATCH | TRANSDERMAL | Status: DC
  Administered 2023-06-21: 1.5 mg via TRANSDERMAL

## 2023-06-21 MED ORDER — ROCURONIUM BROMIDE 100 MG/10ML IV SOLN
INTRAVENOUS | Status: DC | PRN
  Administered 2023-06-21: 40 mg via INTRAVENOUS

## 2023-06-21 MED ORDER — LIDOCAINE HCL (CARDIAC) PF 100 MG/5ML IV SOSY
PREFILLED_SYRINGE | INTRAVENOUS | Status: DC | PRN
  Administered 2023-06-21: 100 mg via INTRAVENOUS

## 2023-06-21 MED ORDER — LIDOCAINE HCL (PF) 1 % IJ SOLN
INTRAMUSCULAR | Status: DC | PRN
Start: 2023-06-21 — End: 2023-06-21
  Administered 2023-06-21: .25 mL

## 2023-06-21 MED ORDER — FENTANYL CITRATE (PF) 100 MCG/2ML IJ SOLN
INTRAMUSCULAR | Status: AC
  Filled 2023-06-21: qty 2

## 2023-06-21 MED ORDER — BUPIVACAINE LIPOSOME 1.3 % IJ SUSP
INTRAMUSCULAR | Status: DC | PRN
Start: 2023-06-21 — End: 2023-06-21
  Administered 2023-06-21: 10 mg via PERINEURAL

## 2023-06-21 MED ORDER — BUPIVACAINE LIPOSOME 1.3 % IJ SUSP
INTRAMUSCULAR | Status: AC
  Filled 2023-06-21: qty 40

## 2023-06-21 MED ORDER — ASPIRIN 325 MG PO TBEC: 325.0000 mg | DELAYED_RELEASE_TABLET | Freq: Every day | ORAL | 0 refills | Status: AC

## 2023-06-21 MED ORDER — DEXAMETHASONE SODIUM PHOSPHATE 10 MG/ML IJ SOLN
INTRAMUSCULAR | Status: DC | PRN
  Administered 2023-06-21: 8 mg via INTRAVENOUS

## 2023-06-21 MED ORDER — ACETAMINOPHEN 500 MG PO TABS
1000.0000 mg | ORAL_TABLET | Freq: Three times a day (TID) | ORAL | 2 refills | Status: AC
Start: 2023-06-21 — End: 2024-06-20

## 2023-06-21 MED ORDER — DEXMEDETOMIDINE HCL IN NACL 200 MCG/50ML IV SOLN
INTRAVENOUS | Status: DC | PRN
Start: 2023-06-21 — End: 2023-06-21
  Administered 2023-06-21: 12 ug via INTRAVENOUS

## 2023-06-21 MED ORDER — ACETAMINOPHEN 10 MG/ML IV SOLN
INTRAVENOUS | Status: DC | PRN
  Administered 2023-06-21: 1000 mg via INTRAVENOUS

## 2023-06-21 MED ORDER — LIDOCAINE HCL (PF) 2 % IJ SOLN
INTRAMUSCULAR | Status: AC
  Filled 2023-06-21: qty 5

## 2023-06-21 MED ORDER — MIDAZOLAM HCL 2 MG/2ML IJ SOLN
2.0000 mg | Freq: Once | INTRAMUSCULAR | Status: AC
  Administered 2023-06-21: 2 mg via INTRAVENOUS

## 2023-06-21 MED ORDER — LACTATED RINGERS IV SOLN: INTRAVENOUS | Status: DC

## 2023-06-21 MED ORDER — RINGERS IRRIGATION IR SOLN
Status: DC | PRN
  Administered 2023-06-21: 12000 mL
  Administered 2023-06-21: 3000 mL

## 2023-06-21 MED ORDER — SUGAMMADEX SODIUM 200 MG/2ML IV SOLN
INTRAVENOUS | Status: DC | PRN
  Administered 2023-06-21: 200 mg via INTRAVENOUS

## 2023-06-21 MED ORDER — CEFAZOLIN SODIUM-DEXTROSE 2-4 GM/100ML-% IV SOLN
2.0000 g | INTRAVENOUS | Status: AC
  Administered 2023-06-21: 2 g via INTRAVENOUS

## 2023-06-21 MED ORDER — PROPOFOL 10 MG/ML IV BOLUS
INTRAVENOUS | Status: AC
  Filled 2023-06-21: qty 20

## 2023-06-21 MED ORDER — ONDANSETRON HCL 4 MG/2ML IJ SOLN
INTRAMUSCULAR | Status: DC | PRN
  Administered 2023-06-21: 4 mg via INTRAVENOUS

## 2023-06-21 MED ORDER — MIDAZOLAM HCL 2 MG/2ML IJ SOLN
INTRAMUSCULAR | Status: AC
  Filled 2023-06-21: qty 2

## 2023-06-21 MED ORDER — DEXAMETHASONE SODIUM PHOSPHATE 4 MG/ML IJ SOLN
INTRAMUSCULAR | Status: AC
  Filled 2023-06-21: qty 1

## 2023-06-21 MED ORDER — SCOPOLAMINE 1 MG/3DAYS TD PT72
MEDICATED_PATCH | TRANSDERMAL | Status: AC
  Filled 2023-06-21: qty 1

## 2023-06-21 MED ORDER — PROPOFOL 10 MG/ML IV BOLUS
INTRAVENOUS | Status: DC | PRN
  Administered 2023-06-21: 50 mg via INTRAVENOUS
  Administered 2023-06-21: 200 mg via INTRAVENOUS
  Administered 2023-06-21: 100 mg via INTRAVENOUS

## 2023-06-21 MED ORDER — ROCURONIUM BROMIDE 10 MG/ML (PF) SYRINGE
PREFILLED_SYRINGE | INTRAVENOUS | Status: AC
  Filled 2023-06-21: qty 10

## 2023-06-21 MED ORDER — DEXAMETHASONE SODIUM PHOSPHATE 10 MG/ML IJ SOLN
INTRAMUSCULAR | Status: DC | PRN
Start: 2023-06-21 — End: 2023-06-21
  Administered 2023-06-21: 10 mg

## 2023-06-21 MED ORDER — ONDANSETRON HCL 4 MG/2ML IJ SOLN: 4.0000 mg | Freq: Once | INTRAMUSCULAR | Status: DC

## 2023-06-21 SURGICAL SUPPLY — 49 items
ADPR IRR PORT MULTIBAG TUBE (MISCELLANEOUS) ×2
ANCH SUT 2 SWLK 19.1 CLS EYLT (Anchor) ×3 IMPLANT
ANCH SUT 2.9 PUSHLOCK ANCH (Orthopedic Implant) ×1 IMPLANT
ANCH SUT 2X2.3 TAPE (Anchor) ×2 IMPLANT
ANCHOR 2.3 SP SGL 1.2 XBRAID (Anchor) IMPLANT
ANCHOR SWIVELOCK BIO 4.75X19.1 (Anchor) IMPLANT
APL PRP STRL LF DISP 70% ISPRP (MISCELLANEOUS) ×1
BLADE SHAVER 4.5X7 STR FR (MISCELLANEOUS) ×1 IMPLANT
BUR BR 5.5 WIDE MOUTH (BURR) IMPLANT
CANNULA PART THRD DISP 5.75X7 (CANNULA) ×1 IMPLANT
CANNULA TWIST IN 8.25X7CM (CANNULA) IMPLANT
CHLORAPREP W/TINT 26 (MISCELLANEOUS) ×1 IMPLANT
COOLER POLAR GLACIER W/PUMP (MISCELLANEOUS) ×1 IMPLANT
COVER LIGHT HANDLE UNIVERSAL (MISCELLANEOUS) ×2 IMPLANT
DRAPE U-SHAPE 48X52 POLY STRL (PACKS) ×2 IMPLANT
DRSG TEGADERM 4X4.75 (GAUZE/BANDAGES/DRESSINGS) ×3 IMPLANT
ELECT REM PT RETURN 9FT ADLT (ELECTROSURGICAL) ×1
ELECTRODE REM PT RTRN 9FT ADLT (ELECTROSURGICAL) ×1 IMPLANT
GAUZE SPONGE 4X4 12PLY STRL (GAUZE/BANDAGES/DRESSINGS) ×1 IMPLANT
GAUZE XEROFORM 1X8 LF (GAUZE/BANDAGES/DRESSINGS) ×1 IMPLANT
GLOVE SRG 8 PF TXTR STRL LF DI (GLOVE) ×1 IMPLANT
GLOVE SURG ENC MOIS LTX SZ7.5 (GLOVE) ×2 IMPLANT
GLOVE SURG ENC MOIS LTX SZ8 (GLOVE) ×1 IMPLANT
GLOVE SURG UNDER POLY LF SZ8 (GLOVE) ×2
GOWN STRL REUS W/ TWL LRG LVL3 (GOWN DISPOSABLE) ×1 IMPLANT
GOWN STRL REUS W/ TWL XL LVL3 (GOWN DISPOSABLE) ×1 IMPLANT
GOWN STRL REUS W/TWL LRG LVL3 (GOWN DISPOSABLE) ×3
GOWN STRL REUS W/TWL XL LVL3 (GOWN DISPOSABLE) ×1
IV LACTATED RINGER IRRG 3000ML (IV SOLUTION) ×5
IV LR IRRIG 3000ML ARTHROMATIC (IV SOLUTION) ×6 IMPLANT
KIT STABILIZATION SHOULDER (MISCELLANEOUS) ×1 IMPLANT
KIT TURNOVER KIT A (KITS) ×1 IMPLANT
MANIFOLD NEPTUNE II (INSTRUMENTS) ×2 IMPLANT
MASK FACE SPIDER DISP (MASK) ×1 IMPLANT
MAT ABSORB FLUID 56X50 GRAY (MISCELLANEOUS) ×2 IMPLANT
PACK ARTHROSCOPY SHOULDER (MISCELLANEOUS) ×1 IMPLANT
PAD ABD DERMACEA PRESS 5X9 (GAUZE/BANDAGES/DRESSINGS) ×2 IMPLANT
PAD WRAPON POLAR SHDR XLG (MISCELLANEOUS) ×1 IMPLANT
PASSER SUT FIRSTPASS SELF (INSTRUMENTS) IMPLANT
SET Y ADAPTER MULIT-BAG IRRIG (MISCELLANEOUS) ×1 IMPLANT
SUT ETHILON 3-0 (SUTURE) ×1 IMPLANT
SUT PDS AB 0 CT1 27 (SUTURE) IMPLANT
SYR 5ML LL (SYRINGE) ×1 IMPLANT
SYSTEM IMPL TENODESIS LNT 2.9 (Orthopedic Implant) IMPLANT
TUBING CONNECTING 10 (TUBING) ×1 IMPLANT
TUBING INFLOW SET DBFLO PUMP (TUBING) ×1 IMPLANT
TUBING OUTFLOW SET DBLFO PUMP (TUBING) ×1 IMPLANT
WAND WEREWOLF FLOW 90D (MISCELLANEOUS) ×1 IMPLANT
WRAPON POLAR PAD SHDR XLG (MISCELLANEOUS) ×1

## 2023-06-21 NOTE — Progress Notes (Signed)
Assisted [place] ANMD with right, supraclavicular, ultrasound guided block. Side rails up, monitors on throughout procedure. See vital signs in flow sheet. Tolerated Procedure well.

## 2023-06-21 NOTE — Anesthesia Postprocedure Evaluation (Signed)
Anesthesia Post Note  Patient: NYEEM STOKE  Procedure(s) Performed: Right shoulder arthroscopic biceps tenodesis, , and  rotator cuff repair (Right: Shoulder) Right shoulder arthroscopic biceps tenodesis, (Right: Shoulder) Right shoulder arthroscopic biceps tenodesis, (Right: Shoulder)  Patient location during evaluation: PACU Anesthesia Type: General Level of consciousness: awake and alert Pain management: pain level controlled Vital Signs Assessment: post-procedure vital signs reviewed and stable Respiratory status: spontaneous breathing, nonlabored ventilation, respiratory function stable and patient connected to nasal cannula oxygen Cardiovascular status: blood pressure returned to baseline and stable Postop Assessment: no apparent nausea or vomiting Anesthetic complications: no   No notable events documented.   Last Vitals:  Vitals:   06/21/23 1030 06/21/23 1039  BP: 121/78 121/73  Pulse: 85   Resp: 18   Temp:  (!) 36.4 C  SpO2: 93% 96%    Last Pain:  Vitals:   06/21/23 1039  TempSrc:   PainSc: 0-No pain                 Theresa Wedel C Holland Kotter

## 2023-06-21 NOTE — Anesthesia Procedure Notes (Addendum)
Anesthesia Regional Block: Interscalene brachial plexus block   Pre-Anesthetic Checklist: , timeout performed,  Correct Patient, Correct Site, Correct Laterality,  Correct Procedure, Correct Position, site marked,  Risks and benefits discussed,  Surgical consent,  Pre-op evaluation,  At surgeon's request and post-op pain management  Laterality: Right  Prep: chloraprep       Needles:  Injection technique: Single-shot  Needle Type: Stimiplex     Needle Length: 10cm  Needle Gauge: 21     Additional Needles:   Procedures:,,,, ultrasound used (permanent image in chart),,    Narrative:  Start time: 06/21/2023 7:21 AM End time: 06/21/2023 7:39 AM Injection made incrementally with aspirations every 5 mL.  Performed by: Personally  Anesthesiologist: Marisue Humble, MD  Additional Notes: Functioning IV was confirmed and monitors applied. Ultrasound guidance: relevant anatomy identified, needle position confirmed, local anesthetic spread visualized around nerve(s)., vascular puncture avoided.  Image printed for medical record.  Negative aspiration and no paresthesias; incremental administration of local anesthetic. The patient tolerated the procedure well. Vitals signs recorded in RN notes. Also placed superficial cervical plexus block and intercostobrachial block as field blocks.

## 2023-06-21 NOTE — Anesthesia Procedure Notes (Signed)
Procedure Name: Intubation Date/Time: 06/21/2023 7:55 AM  Performed by: Irving Burton, CRNAPre-anesthesia Checklist: Patient identified, Patient being monitored, Timeout performed, Emergency Drugs available and Suction available Patient Re-evaluated:Patient Re-evaluated prior to induction Oxygen Delivery Method: Circle system utilized Preoxygenation: Pre-oxygenation with 100% oxygen Induction Type: IV induction Ventilation: Mask ventilation without difficulty, Two handed mask ventilation required and Oral airway inserted - appropriate to patient size Laryngoscope Size: McGraph and 4 Grade View: Grade I Tube type: Oral Tube size: 7.0 mm Number of attempts: 1 Airway Equipment and Method: Stylet and Video-laryngoscopy Placement Confirmation: ETT inserted through vocal cords under direct vision, positive ETCO2 and breath sounds checked- equal and bilateral Secured at: 22 cm Tube secured with: Tape Dental Injury: Teeth and Oropharynx as per pre-operative assessment  Difficulty Due To: Difficult Airway- due to anterior larynx

## 2023-06-21 NOTE — Op Note (Signed)
SURGERY DATE: 06/21/2023   PRE-OP DIAGNOSIS:  1. Right biceps tendinopathy 2. Right shoulder SLAP tear 3. Right rotator cuff tear   POST-OP DIAGNOSIS: 1. Right biceps tendinopathy 2. Right shoulder SLAP tear 3. Right rotator cuff tear  PROCEDURES:  1. Right arthroscopic rotator cuff repair (high-grade articular side tear involving ~90% of footprint 2. Right arthroscopic biceps tenodesis 3. Right arthroscopic extensive debridement of shoulder (glenohumeral and subacromial spaces)   SURGEON: Rosealee Albee, MD   ASSISTANT: Sonny Dandy, PA   ANESTHESIA: Gen with Exparel interscalene block   ESTIMATED BLOOD LOSS: 5cc   DRAINS:  none   TOTAL IV FLUIDS: per anesthesia      SPECIMENS: none   IMPLANTS:  - Arthrex 2.68mm PushLock x 1 - Arthrex 4.4mm SwiveLock x 2 - Iconix SPEED double loaded with 1.2 and 2.31mm tape x 2     OPERATIVE FINDINGS:  Examination under anesthesia: A careful examination under anesthesia was performed.  Passive range of motion was: FF: 150; ER at side: 50; ER in abduction: 90; IR in abduction: 45.  Anterior load shift: 1+.  Posterior load shift: 1+.  Sulcus in neutral: 1+   Intra-operative findings: A thorough arthroscopic examination of the shoulder was performed.  The findings are: 1. Biceps tendon: tendinopathy with significant erythema at the biceps anchor 2. Superior labrum: Type II SLAP tear 3. Posterior labrum and capsule: normal 4. Inferior capsule and inferior recess: normal 5. Glenoid cartilage surface: Normal 6. Supraspinatus attachment: High-grade articular sided tear involving 90% of the footprint.  Tear affected almost entire anterior to posterior width of the supraspinatus of the supraspinatus 7. Posterior rotator cuff attachment: normal 8. Humeral head articular cartilage: normal 9. Rotator interval: significant synovitis 10: Subscapularis tendon: attachment intact 11. Anterior labrum: Erythematous and mildly degenerative 12.  IGHL: normal   OPERATIVE REPORT:    Indications for procedure:  Dylan Choi is a 37 y.o. male with remote history of prior [facility] joint separation that was asymptomatic, and now with approximately 5 months of right shoulder pain after a slip and fall.  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  He has failed extensive nonoperative measures including activity modifications, medications, physical therapy, and corticosteroid injections.  Clinical exam and MRI are consistent with symptomatic biceps tendon pathology/SLAP tear and rotator cuff tear.  After discussion of risks, benefits, and alternatives to surgery, the patient elected to proceed.    Procedure in detail:   I identified Dylan Choi in the pre-operative holding area.  I marked the operative shoulder with my initials. I reviewed the risks and benefits of the proposed surgical intervention, and the patient wished to proceed.  Anesthesia was then performed with an Exparel interscalene block.  The patient was transferred to the operative suite and placed in the beach chair position.     Appropriate IV antibiotics were administered prior to incision. The operative upper extremity was then prepped and draped in standard fashion. A time out was performed confirming the correct extremity, correct patient, and correct procedure.    I then created a standard posterior portal with an 11 blade. The glenohumeral joint was easily entered with a blunt trocar and the arthroscope introduced. The findings of diagnostic arthroscopy are described above. I debrided degenerative tissue including the synovitic tissue about the rotator interval and anterior and superior labrum. I then coagulated the inflamed synovium to obtain hemostasis and reduce the risk of post-operative swelling using  an Designer, jewellery.   To address the SLAP tear, we decided to proceed with an arthroscopic biceps tenodesis. The Loop n Tack  technique was used to pass a FiberTape through the biceps in a locked fashion adjacent to the biceps anchor.  A hole for a 2.9 mm Arthrex PushLock was drilled in the bicipital groove just superior to the subscapularis tendon insertion.  The biceps tendon was then cut and the biceps anchor complex was debrided down to a stable base on the superior labrum.  The FiberTape was loaded onto the PushLock anchor and impacted into place into the previously drilled hole in the bicipital groove.  This appropriately secured the biceps into the bicipital groove and took it off of tension.   The articular sided tear was marked with an 0 PDS suture while in the glenohumeral joint.  Next, the arthroscope was then introduced into the subacromial space. A direct lateral portal was created with an 11-blade after spinal needle localization. An extensive subacromial bursectomy and debridement was performed using a combination of the shaver and Arthrocare wand. The entire acromial undersurface was exposed.  0 PDS suture was identified.  The region around the suture was gently probed with a switching stick.  The switching stick was easily able to push through remnant bursal sided supraspinatus fibers such that it easily entered the glenohumeral joint.   Next, I created an accessory posterolateral portal to assist with visualization and instrumentation.  I debrided the poor quality edges of the supraspinatus tendon.  This was a small, U-shaped tear of the supraspinatus.  I prepared the footprint using a burr to expose bleeding bone.     I then percutaneously placed one Iconix SPEED medial row anchor along the anterior portion of the tear at the articular margin. Another SPEED anchor was placed along the posterior portion of the tear at the articular margin. I then shuttled all the strands of tape through the rotator cuff just lateral to the musculotendinous junction using a FirstPass suture passer spanning the anterior to posterior  extent of the tear. The posterior strands of each suture were passed through an [employer] anchor.  This was placed approximately 2 cm distal to the lateral edge of the footprint in line with the posterior aspect of the tear with appropriate tensioning of each suture prior to final fixation.  Similarly, the anterior strands of each suture were passed through another SwiveLock anchor along the anterior margin of the tear. The knotless mechanism of the posterior SwiveLock anchor was utilized to further reduce the central area of the rotator cuff tear.  This construct allowed for excellent reapproximation of the rotator cuff to its native footprint without undue tension.  Appropriate compression was achieved.  The repair was stable to external and internal rotation.   Fluid was evacuated from the shoulder, and the portals were closed with 3-0 Nylon. Xeroform was applied to the portals. A sterile dressing was applied, followed by a Polar Care sleeve and a SlingShot shoulder immobilizer/sling. The patient was awakened from anesthesia without difficulty and was transferred to the PACU in stable condition.    Of note, assistance from a PA was essential to performing the surgery.  PA was present for the entire surgery.  PA assisted with patient positioning, retraction, instrumentation, and wound closure. The surgery would have been more difficult and had longer operative time without PA assistance.   COMPLICATIONS: none   DISPOSITION: plan for discharge home after recovery in PACU  POSTOPERATIVE PLAN: Remain in sling (except hygiene and elbow/wrist/hand RoM exercises as instructed by PT) x 6 weeks and NWB for this time. PT to begin 3-4 days after surgery.  Large rotator cuff repair rehab protocol. ASA 325mg  daily x 2 weeks for DVT ppx.

## 2023-06-21 NOTE — Transfer of Care (Signed)
Immediate Anesthesia Transfer of Care Note  Patient: KEM HENSEN  Procedure(s) Performed: Right shoulder arthroscopic biceps tenodesis, , and  rotator cuff repair (Right: Shoulder) Right shoulder arthroscopic biceps tenodesis, (Right: Shoulder) Right shoulder arthroscopic biceps tenodesis, (Right: Shoulder)  Patient Location: PACU  Anesthesia Type:General  Level of Consciousness: drowsy  Airway & Oxygen Therapy: Patient Spontanous Breathing and Patient connected to face mask oxygen  Post-op Assessment: Report given to RN and Post -op Vital signs reviewed and stable  Post vital signs: Reviewed and stable  Last Vitals:  Vitals Value Taken Time  BP    Temp    Pulse 72 06/21/23 1014  Resp 17 06/21/23 1014  SpO2 98 % 06/21/23 1014  Vitals shown include unfiled device data.  Last Pain:  Vitals:   06/21/23 0642  TempSrc: Temporal  PainSc: 3          Complications: No notable events documented.

## 2023-06-21 NOTE — H&P (Signed)
Paper H&P to be scanned into permanent record. H&P reviewed. No significant changes noted.  

## 2023-06-24 ENCOUNTER — Encounter: Payer: Self-pay | Admitting: Orthopedic Surgery

## 2023-07-05 NOTE — Plan of Care (Signed)
 [facility] Tonsillectomy/Adenoidectomy, Postoperative PEDS care plan entered in error.

## 2023-07-23 ENCOUNTER — Telehealth: Payer: Self-pay | Admitting: Urology

## 2023-07-23 NOTE — Telephone Encounter (Signed)
Patient scheduled a new patient appointment through mychart with Dr. Lonna Cobb for a vasectomy consult. He last saw Dr. Richardo Hanks on 03/15/20. Is it ok for patient to see Dr. Lonna Cobb? Please advise.

## 2023-08-23 ENCOUNTER — Ambulatory Visit: Payer: Managed Care, Other (non HMO) | Admitting: Urology

## 2023-08-23 VITALS — Ht 69.0 in | Wt 215.0 lb

## 2023-08-23 DIAGNOSIS — Z3009 Encounter for other general counseling and advice on contraception: Secondary | ICD-10-CM | POA: Diagnosis not present

## 2023-08-23 NOTE — Progress Notes (Signed)
Dylan Choi,acting as a scribe for Dylan Altes, MD.,have documented all relevant documentation on the behalf of Dylan Altes, MD,as directed by  Dylan Altes, MD while in the presence of Dylan Altes, MD.  08/23/23 1:28 PM   Dylan Choi 1985-10-26 [id number]  Referring provider: Mick Sell, MD [address] [place],  [place] [address]  Chief Complaint  Patient presents with   VAS Consult    HPI: Dylan Choi is a 37 y.o. male who presents for vasectomy counseling.  Married with 1 child Denies prior history urologic problems including chronic scrotal pain, epididymitis or orchitis No previous history inguinal hernia or pelvic surgery No history of bleeding or clotting disorders   PMH: Past Medical History:  Diagnosis Date   Asthma    Complication of anesthesia    Headache after achilles surgery   Medical history non-contributory    PONV (postoperative nausea and vomiting)     Surgical History: Past Surgical History:  Procedure Laterality Date   ACHILLES TENDON SURGERY Right 06/13/2021   Procedure: ACHILLES TENDON REPAIR;  Surgeon: Dylan Flake, MD;  Location: [facility] OR[facility];  Service: Orthopedics;  Laterality: Right;   ARTHOSCOPIC ROTAOR CUFF REPAIR Right 06/21/2023   Procedure: Right shoulder arthroscopic biceps tenodesis,;  Surgeon: Dylan Kell, MD;  Location: [facility];  Service: Orthopedics;  Laterality: Right;   LABRAL REPAIR Right 06/21/2023   Procedure: Right shoulder arthroscopic biceps tenodesis,;  Surgeon: Dylan Kell, MD;  Location: [facility];  Service: Orthopedics;  Laterality: Right;   SHOULDER ARTHROSCOPY Right 06/21/2023   Procedure: Right shoulder arthroscopic biceps tenodesis, , and  rotator cuff repair;  Surgeon: Dylan Kell, MD;  Location: [facility];  Service: Orthopedics;  Laterality: Right;    Home Medications:  Allergies as of 08/23/2023       Reactions   Dilaudid  [hydromorphone] Other (See Comments)   Difficulty swallowing        Medication List        Accurate as of August 23, 2023  1:28 PM. If you have any questions, ask your nurse or doctor.          STOP taking these medications    ondansetron 4 MG disintegrating tablet Commonly known as: ZOFRAN-ODT   oxyCODONE 5 MG immediate release tablet Commonly known as: Roxicodone       TAKE these medications    acetaminophen 500 MG tablet Commonly known as: TYLENOL Take 2 tablets (1,000 mg total) by mouth every 8 (eight) hours.   albuterol 108 (90 Base) MCG/ACT inhaler Commonly known as: VENTOLIN HFA Inhale 2 puffs into the lungs every 6 (six) hours as needed for wheezing.        Allergies:  Allergies  Allergen Reactions   Dilaudid [Hydromorphone] Other (See Comments)    Difficulty swallowing    Family History: No family history on file.  Social History:  reports that he has never smoked. He has never used smokeless tobacco. He reports current alcohol use of about 6.0 standard drinks of alcohol per week. No history on file for drug use.   Physical Exam: Ht 5\' 9"  (1.753 m)   Wt 215 lb (97.5 kg)   BMI 31.75 kg/m   Constitutional:  Alert and oriented, No acute distress. HEENT: [place] AT Respiratory: Normal respiratory effort, no increased work of breathing GU: Phallus without lesions, testes descended bilaterally without masses or tenderness, spermatic cord/epididymis palpably normal bilaterally.  Vasa palpable bilaterally  Psychiatric: Normal mood and affect.   Assessment & Plan:    1.  Undesired fertility Desires to schedule vasectomy We had a long discussion about vasectomy. We specifically discussed the procedure, recovery and the risks, benefits and alternatives of vasectomy. I explained that the procedure entails removal of a segment of each vas deferens, each of which conducts sperm, and that the purpose of this procedure is to cause sterility (inability to  produce children or cause pregnancy). Vasectomy is intended to be permanent and irreversible form of contraception. Options for fertility after vasectomy include vasectomy reversal, or sperm retrieval with in vitro fertilization. These options are not always successful, and they may be expensive. We discussed reversible forms of birth control such as condoms, IUD or diaphragms, as well as the option of freezing sperm in a sperm bank prior to the vasectomy procedure. We discussed the importance of avoiding strenuous exercise for four days after vasectomy, and the importance of refraining from any form of ejaculation for seven days after vasectomy. I explained that vasectomy does not produce immediate sterility so another form of contraceptive must be used until sterility is assured by having semen checked for sperm. Thus, a post vasectomy semen analysis is necessary to confirm sterility. Rarely, vasectomy must be repeated. We discussed the approximately 1 in 2,000 risk of pregnancy after vasectomy for men who have post-vasectomy semen analysis showing absent sperm or rare non-motile sperm. Typical side effects include a small amount of oozing blood, some discomfort and mild swelling in the area of incision.  Vasectomy does not affect sexual performance, function, please, sensation, interest, desire, satisfaction, penile erection, volume of semen or ejaculation. Other rare risks include allergy or adverse reaction to an anesthetic, testicular atrophy, hematoma, infection/abscess, prolonged tenderness of the vas deferens, pain, swelling, painful nodule or scar (called sperm granuloma) or epididymtis. We discussed chronic testicular pain syndrome. This has been reported to occur in as many as 1-2% of men and may be permanent. This can be treated with medication, small procedures or (rarely) surgery. He indicated he would call back if he desires a preprocedure anxiolytic and would need a driver if  utilizing   [facility] [address], [address] [place], [place] [address] [phone]

## 2023-08-23 NOTE — Patient Instructions (Signed)
Pre-Vasectomy Instructions ? ?STOP all aspirin or blood thinners (Aspirin, Plavix, Coumadin, Warfarin, Motrin, Ibuprofen, Advil, Aleve, Naproxen, Naprosyn) for 7 days prior to the procedure.  If you have any questions about stopping these medications please contact your primary care physician or cardiologist. ? ?Shave all hair from the upper scrotum on the day of the procedure.  This means just under the penis onto the scrotal sac.  The area shaved should measure about 2-3 inches around.  You may lather the scrotum with soap and water, and shave with a safety razor. ? ?After shaving the area, thoroughly wash the penis and the scrotum, then shower or bathe to remove all the loose hairs.  If needed, wash the area again just before coming in for your Vasectomy. ? ?It is recommended to have a light meal an hour or so prior to the procedure. ? ?Bring a scrotal support (jock strap or suspensory, or tight jockey shorts or underwear).  Wear comfortable pants or shorts. ? ?While the actual procedure usually takes about 45 minutes, you should be prepared to stay in the office for approximately one hour.  Bring someone with you to drive you home. ? ?If you have any questions or concerns, please feel free to call the office at [phone]. ?  ?

## 2023-08-26 ENCOUNTER — Encounter: Payer: Self-pay | Admitting: Urology

## 2023-09-25 ENCOUNTER — Encounter: Payer: Managed Care, Other (non HMO) | Admitting: Urology

## 2023-10-25 ENCOUNTER — Encounter: Payer: Self-pay | Admitting: Urology

## 2023-12-06 ENCOUNTER — Encounter: Payer: Managed Care, Other (non HMO) | Admitting: Urology

## 2024-03-20 ENCOUNTER — Ambulatory Visit (INDEPENDENT_AMBULATORY_CARE_PROVIDER_SITE_OTHER): Admitting: Urology

## 2024-03-20 ENCOUNTER — Encounter: Payer: Self-pay | Admitting: Urology

## 2024-03-20 VITALS — BP 154/99 | HR 61 | Ht 69.0 in | Wt 220.0 lb

## 2024-03-20 DIAGNOSIS — Z302 Encounter for sterilization: Secondary | ICD-10-CM | POA: Diagnosis not present

## 2024-03-20 MED ORDER — HYDROCODONE-ACETAMINOPHEN 5-325 MG PO TABS
1.0000 | ORAL_TABLET | Freq: Four times a day (QID) | ORAL | 0 refills | Status: AC | PRN
Start: 2024-03-20 — End: ?

## 2024-03-20 NOTE — Progress Notes (Signed)
   Vasectomy Procedure Note  Indications: Dylan Choi is a 38 y.o. male who presents today for elective sterilization.  He has been consented for the procedure.  He is aware of the risks and benefits.  He had no additional questions.  He agrees to proceed.  He denies any other significant change since his last visit.  Pre-operative Diagnosis: Elective sterilization  Post-operative Diagnosis: Elective sterilization  Premedication: N/A  Surgeon: Glendia C. Akhila Mahnken, M.D  Description: The patient was prepped and draped in the standard fashion.  The right vas deferens was identified and brought superiorly to the anterior scrotal skin.  The skin and vas were then anesthetized utilizing 8 ml 1% lidocaine .  A small stab incision was made and spread with the vas dissector.  The vas was grasped utilizing the vas clamp and elevated out of the incision. The vas sheath was incised and the vas was dissected out of the sheath, elevated and dissected free from the surrounding tissue and vessels.  A 1 cm segment was excised. The vas lumens were cauterized with a needle tip electrocautery for a distance of at least 1 cm.  Fascial interposition was performed on the testicular end with a figure-of-eight 3-0 chromic suture.   No significant bleeding was observed.  The vas ends were then dropped back into the hemiscrotum.  The skin was closed with hemostatic pressure.  An identical procedure was performed on the contralateral side.  Clean dry gauze was applied to the incision sites.  The patient tolerated the procedure well.  Complications:None  Recommendations: 1.  No lifting greater than 10 pounds or strenuous activity for 1 week. 2.  Scrotal support for 1-2 weeks. 3.  May shower in 24 hours; no bath, hot tub for 1 week 4.  No intercourse for at least 7 days and resume based on level of discomfort  5.  Continue alternate contraception for 12 weeks.  6.  Call for significant pain, swelling, redness, drainage  or fever greater than 100.5. 7.  Rx hydrocodone /APAP 5/325 1-2 every 6 hours prn pain. 8.  Follow-up semen analysis in 12 weeks.   Glendia Barba, MD

## 2024-03-20 NOTE — Patient Instructions (Signed)

## 2024-06-18 ENCOUNTER — Other Ambulatory Visit
